# Patient Record
Sex: Male | Born: 1975
Health system: Southern US, Community
[De-identification: ages and names within clinical notes are randomized; demographics above are authoritative.]

## PROBLEM LIST (undated history)

## (undated) DIAGNOSIS — E079 Disorder of thyroid, unspecified: Secondary | ICD-10-CM

---

## 2003-09-20 ENCOUNTER — Ambulatory Visit: Payer: Self-pay | Admitting: Family Medicine

## 2004-09-03 ENCOUNTER — Ambulatory Visit: Payer: Self-pay | Admitting: Internal Medicine

## 2004-09-19 ENCOUNTER — Ambulatory Visit: Payer: Self-pay | Admitting: *Deleted

## 2004-09-24 ENCOUNTER — Ambulatory Visit: Payer: Self-pay | Admitting: Internal Medicine

## 2004-10-16 ENCOUNTER — Ambulatory Visit: Payer: Self-pay | Admitting: Family Medicine

## 2004-10-25 ENCOUNTER — Ambulatory Visit: Payer: Self-pay | Admitting: Family Medicine

## 2004-11-19 ENCOUNTER — Ambulatory Visit: Payer: Self-pay | Admitting: Family Medicine

## 2004-11-19 ENCOUNTER — Encounter (HOSPITAL_COMMUNITY): Admission: RE | Admit: 2004-11-19 | Discharge: 2004-12-13 | Payer: Self-pay | Admitting: Family Medicine

## 2004-11-30 ENCOUNTER — Emergency Department (HOSPITAL_COMMUNITY): Admission: EM | Admit: 2004-11-30 | Discharge: 2004-11-30 | Payer: Self-pay | Admitting: Emergency Medicine

## 2004-12-11 ENCOUNTER — Ambulatory Visit: Payer: Self-pay | Admitting: Family Medicine

## 2005-01-25 ENCOUNTER — Ambulatory Visit: Payer: Self-pay | Admitting: Family Medicine

## 2005-02-27 ENCOUNTER — Ambulatory Visit: Payer: Self-pay | Admitting: Internal Medicine

## 2005-04-11 ENCOUNTER — Ambulatory Visit: Payer: Self-pay | Admitting: Family Medicine

## 2005-04-25 ENCOUNTER — Ambulatory Visit: Payer: Self-pay | Admitting: Family Medicine

## 2005-05-01 ENCOUNTER — Encounter (HOSPITAL_COMMUNITY): Admission: RE | Admit: 2005-05-01 | Discharge: 2005-07-30 | Payer: Self-pay | Admitting: Family Medicine

## 2005-05-06 ENCOUNTER — Ambulatory Visit: Payer: Self-pay | Admitting: Family Medicine

## 2005-07-10 ENCOUNTER — Ambulatory Visit: Payer: Self-pay | Admitting: Family Medicine

## 2005-08-28 ENCOUNTER — Encounter (HOSPITAL_COMMUNITY): Admission: RE | Admit: 2005-08-28 | Discharge: 2005-09-26 | Payer: Self-pay | Admitting: Family Medicine

## 2005-09-19 ENCOUNTER — Ambulatory Visit: Payer: Self-pay | Admitting: Family Medicine

## 2005-11-19 ENCOUNTER — Ambulatory Visit: Payer: Self-pay | Admitting: Family Medicine

## 2006-03-28 ENCOUNTER — Ambulatory Visit: Payer: Self-pay | Admitting: Internal Medicine

## 2006-04-17 ENCOUNTER — Encounter (INDEPENDENT_AMBULATORY_CARE_PROVIDER_SITE_OTHER): Payer: Self-pay | Admitting: Cardiology

## 2006-04-17 ENCOUNTER — Encounter (INDEPENDENT_AMBULATORY_CARE_PROVIDER_SITE_OTHER): Payer: Self-pay | Admitting: Internal Medicine

## 2006-10-01 ENCOUNTER — Encounter (INDEPENDENT_AMBULATORY_CARE_PROVIDER_SITE_OTHER): Payer: Self-pay | Admitting: *Deleted

## 2006-12-15 ENCOUNTER — Telehealth (INDEPENDENT_AMBULATORY_CARE_PROVIDER_SITE_OTHER): Payer: Self-pay | Admitting: Internal Medicine

## 2007-01-05 ENCOUNTER — Ambulatory Visit: Payer: Self-pay | Admitting: Family Medicine

## 2007-01-05 ENCOUNTER — Encounter (INDEPENDENT_AMBULATORY_CARE_PROVIDER_SITE_OTHER): Payer: Self-pay | Admitting: Nurse Practitioner

## 2007-01-05 DIAGNOSIS — E018 Other iodine-deficiency related thyroid disorders and allied conditions: Secondary | ICD-10-CM

## 2007-01-06 LAB — CONVERTED CEMR LAB
Free T4: 1.07 ng/dL (ref 0.89–1.80)
TSH: 33.62 microintl units/mL — ABNORMAL HIGH (ref 0.350–5.50)

## 2007-03-03 ENCOUNTER — Ambulatory Visit: Payer: Self-pay | Admitting: Family Medicine

## 2007-03-03 ENCOUNTER — Encounter (INDEPENDENT_AMBULATORY_CARE_PROVIDER_SITE_OTHER): Payer: Self-pay | Admitting: Nurse Practitioner

## 2007-03-04 ENCOUNTER — Telehealth (INDEPENDENT_AMBULATORY_CARE_PROVIDER_SITE_OTHER): Payer: Self-pay | Admitting: Nurse Practitioner

## 2007-03-04 LAB — CONVERTED CEMR LAB
Free T4: 1.32 ng/dL (ref 0.89–1.80)
TSH: 8.325 microintl units/mL — ABNORMAL HIGH (ref 0.350–5.50)

## 2007-04-29 ENCOUNTER — Ambulatory Visit: Payer: Self-pay | Admitting: Nurse Practitioner

## 2007-04-29 LAB — CONVERTED CEMR LAB: TSH: 4.161 microintl units/mL (ref 0.350–5.50)

## 2007-07-01 ENCOUNTER — Ambulatory Visit: Payer: Self-pay | Admitting: Family Medicine

## 2007-07-03 LAB — CONVERTED CEMR LAB: TSH: 2.135 microintl units/mL (ref 0.350–5.50)

## 2007-11-24 ENCOUNTER — Ambulatory Visit: Payer: Self-pay | Admitting: Family Medicine

## 2007-11-24 LAB — CONVERTED CEMR LAB: TSH: 1.291 microintl units/mL (ref 0.350–4.50)

## 2008-06-28 ENCOUNTER — Telehealth (INDEPENDENT_AMBULATORY_CARE_PROVIDER_SITE_OTHER): Payer: Self-pay | Admitting: *Deleted

## 2008-12-29 ENCOUNTER — Ambulatory Visit: Payer: Self-pay | Admitting: Physician Assistant

## 2008-12-29 ENCOUNTER — Encounter (INDEPENDENT_AMBULATORY_CARE_PROVIDER_SITE_OTHER): Payer: Self-pay | Admitting: Nurse Practitioner

## 2008-12-29 LAB — CONVERTED CEMR LAB: TSH: 2.916 microintl units/mL (ref 0.350–4.500)

## 2008-12-30 ENCOUNTER — Encounter (INDEPENDENT_AMBULATORY_CARE_PROVIDER_SITE_OTHER): Payer: Self-pay | Admitting: Nurse Practitioner

## 2009-03-13 ENCOUNTER — Telehealth (INDEPENDENT_AMBULATORY_CARE_PROVIDER_SITE_OTHER): Payer: Self-pay | Admitting: Nurse Practitioner

## 2009-03-15 ENCOUNTER — Ambulatory Visit: Payer: Self-pay | Admitting: Nurse Practitioner

## 2009-12-22 ENCOUNTER — Encounter (INDEPENDENT_AMBULATORY_CARE_PROVIDER_SITE_OTHER): Payer: Self-pay | Admitting: Nurse Practitioner

## 2009-12-22 ENCOUNTER — Ambulatory Visit: Payer: Self-pay | Admitting: Nurse Practitioner

## 2009-12-22 LAB — CONVERTED CEMR LAB
ALT: 23 units/L (ref 0–53)
AST: 24 units/L (ref 0–37)
Albumin: 4.5 g/dL (ref 3.5–5.2)
Alkaline Phosphatase: 78 units/L (ref 39–117)
BUN: 13 mg/dL (ref 6–23)
Basophils Absolute: 0 10*3/uL (ref 0.0–0.1)
Basophils Relative: 1 % (ref 0–1)
CO2: 28 meq/L (ref 19–32)
Calcium: 9.2 mg/dL (ref 8.4–10.5)
Chloride: 100 meq/L (ref 96–112)
Creatinine, Ser: 0.84 mg/dL (ref 0.40–1.50)
Eosinophils Absolute: 0.2 10*3/uL (ref 0.0–0.7)
Eosinophils Relative: 2 % (ref 0–5)
Glucose, Bld: 95 mg/dL (ref 70–99)
HCT: 45.4 % (ref 39.0–52.0)
Hemoglobin: 14.9 g/dL (ref 13.0–17.0)
Lymphocytes Relative: 24 % (ref 12–46)
Lymphs Abs: 2.1 10*3/uL (ref 0.7–4.0)
MCHC: 32.8 g/dL (ref 30.0–36.0)
MCV: 84.7 fL (ref 78.0–100.0)
Monocytes Absolute: 0.7 10*3/uL (ref 0.1–1.0)
Monocytes Relative: 8 % (ref 3–12)
Neutro Abs: 5.6 10*3/uL (ref 1.7–7.7)
Neutrophils Relative %: 65 % (ref 43–77)
Platelets: 306 10*3/uL (ref 150–400)
Potassium: 4.6 meq/L (ref 3.5–5.3)
RBC: 5.36 M/uL (ref 4.22–5.81)
RDW: 13.5 % (ref 11.5–15.5)
Sodium: 137 meq/L (ref 135–145)
TSH: 2.249 microintl units/mL (ref 0.350–4.500)
Total Bilirubin: 0.4 mg/dL (ref 0.3–1.2)
Total Protein: 7.1 g/dL (ref 6.0–8.3)
WBC: 8.6 10*3/uL (ref 4.0–10.5)

## 2009-12-25 ENCOUNTER — Encounter (INDEPENDENT_AMBULATORY_CARE_PROVIDER_SITE_OTHER): Payer: Self-pay | Admitting: Nurse Practitioner

## 2010-01-04 ENCOUNTER — Encounter (INDEPENDENT_AMBULATORY_CARE_PROVIDER_SITE_OTHER): Payer: Self-pay | Admitting: Nurse Practitioner

## 2010-01-04 ENCOUNTER — Ambulatory Visit: Payer: Self-pay | Admitting: Nurse Practitioner

## 2010-01-04 LAB — CONVERTED CEMR LAB
Cholesterol: 193 mg/dL (ref 0–200)
HDL: 47 mg/dL (ref >39)
LDL Cholesterol: 129 mg/dL — ABNORMAL HIGH (ref 0–99)
Total CHOL/HDL Ratio: 4.1
Triglycerides: 83 mg/dL (ref <150)
VLDL: 17 mg/dL (ref 0–40)

## 2010-01-05 ENCOUNTER — Encounter (INDEPENDENT_AMBULATORY_CARE_PROVIDER_SITE_OTHER): Payer: Self-pay | Admitting: Nurse Practitioner

## 2010-02-11 LAB — CONVERTED CEMR LAB
Bilirubin Urine: NEGATIVE
Blood in Urine, dipstick: NEGATIVE
Glucose, Urine, Semiquant: NEGATIVE
Ketones, urine, test strip: NEGATIVE
Nitrite: NEGATIVE
Protein, U semiquant: NEGATIVE
Specific Gravity, Urine: 1.015
Urobilinogen, UA: 0.2
WBC Urine, dipstick: NEGATIVE
pH: 7.5

## 2010-02-14 NOTE — Letter (Signed)
Summary: Handout Printed  Printed Handout:  - Plantar Fasciitis 

## 2010-02-14 NOTE — Progress Notes (Signed)
Summary: Office Visit//DEPRESSION SCREENING  Office Visit//DEPRESSION SCREENING   Imported By: Arta Bruce 12/22/2009 12:26:11  _____________________________________________________________________  External Attachment:    Type:   Image     Comment:   External Document

## 2010-02-14 NOTE — Progress Notes (Signed)
   Phone Note Call from Patient Call back at [facility] Phone [phone]   Summary of Call: Since last night, the pt has fever 101.5) and he is taking tylenol but the fever comes and go.   Initial call taken by: Manon Hilding,  March 13, 2009 8:46 AM  Follow-up for Phone Call        spoke with pt and he says he feels alot better Follow-up by: [place] Shannon,  March 16, 2009 12:34 PM

## 2010-02-14 NOTE — Assessment & Plan Note (Signed)
Summary: Acute - Sinusitis   Vital Signs:  Patient profile:   35 year old male Weight:      200.8 pounds Temp:     98.8 degrees F oral Pulse rate:   65 / minute Pulse rhythm:   regular Resp:     16 per minute BP sitting:   125 / 77  (left arm) Cuff size:   large  Vitals Entered By: Levon Hedger (March 15, 2009 9:04 AM) CC: pt had fever sunday and monday and feeling dizzy,headache and nasal congestion with nose bleed, some cough and has been using Tylenol for fever Is Patient Diabetic? No Pain Assessment Patient in pain? yes     Location: headache  Does patient need assistance? Functional Status Self care Ambulation Normal   Primary Care Provider:  Tereso Newcomer, PA-C  CC:  pt had fever sunday and monday and feeling dizzy, headache and nasal congestion with nose bleed, and some cough and has been using Tylenol for fever.  History of Present Illness:  Pt into the office with compaints of congestion Symptoms started 4 days ago +fever on first 2 days +nausea +headache +appetite has decreased +Right ear pain, left ear pain -sore throat +nasal congestion  Pt has been taking tylenol ES and also some OTC cold products for cough  Social - pt is employed at a nursery - flowers.  He has been to work earlier this week but was unable to go today due to illness  Presents today with wife and child.   Habits & Providers  Alcohol-Tobacco-Diet     Alcohol drinks/day: 0     Tobacco Status: quit  Exercise-Depression-Behavior     Does Patient Exercise: yes     Type of exercise: weight lifting     Have you felt down or hopeless? no     Have you felt little pleasure in things? no     Depression Counseling: not indicated; screening negative for depression     Drug Use: no     Sun Exposure: occasionally  Allergies (verified): No Known Drug Allergies  Review of Systems General:  Complains of chills, fever, and loss of appetite. ENT:  Complains of earache, nasal  congestion, nosebleeds, and sinus pressure; denies sore throat; right ear,. CV:  Complains of chest pain or discomfort; when cough. Resp:  Denies cough. GI:  Denies abdominal pain. GU:  Denies discharge. Allergy:  Denies sneezing.  Physical Exam  General:  alert.   Head:  normocephalic.   Eyes:  pupils round.   Ears:  left ear cerumen impaction right ear - inflammation, soft wax (pt put some alcohol drops in the ear) Nose:  Bil turbinate inflammation right nare with dried bloody drainage frontal sinus tenderness Mouth:  poor dentition.   Lungs:  normal breath sounds.   Heart:  normal rate and regular rhythm.     Impression & Recommendations:  Problem # 1:  SINUSITIS (ICD-473.9) advised pt of dx humidify air at home continue otc allergy meds for relief His updated medication list for this problem includes:    Amoxicillin 500 Mg Tabs (Amoxicillin) ..... One tablet by mouth three times a day for infection    Fluticasone Propionate 50 Mcg/act Susp (Fluticasone propionate) ..... One spray in each nostril two times a day **hold head down**  Problem # 2:  CERUMEN IMPACTION, BILATERAL (ICD-380.4) advised pt to schedule an appointment for ear irrigation when feeling better  Complete Medication List: 1)  Levothroid 125 Mcg Tabs (Levothyroxine sodium) .Marland KitchenMarland KitchenMarland Kitchen  1 tablet by mouth daily for thyroid 2)  Ketoconazole 2 % Crea (Ketoconazole) .... Apply two times a day x 2 weeks 3)  Amoxicillin 500 Mg Tabs (Amoxicillin) .... One tablet by mouth three times a day for infection 4)  Fluticasone Propionate 50 Mcg/act Susp (Fluticasone propionate) .... One spray in each nostril two times a day **hold head down**  Patient Instructions: 1)  May continue the over the counter cough and allergy medications 2)  Take amoxil 500mg  by mouth three times a day for infection 3)  use the nasal spray twice daily - hold head down 4)  Follow up as needed 5)  You can schedule an appointment to get your ears  cleaned when you are feeling better Prescriptions: FLUTICASONE PROPIONATE 50 MCG/ACT SUSP (FLUTICASONE PROPIONATE) One spray in each nostril two times a day **hold head down**  #1 x 1   Entered and Authorized by:   [name] FNP   Signed by:   [name] FNP on 03/15/2009   Method used:   Print then Give to Patient   RxID:   [id number] AMOXICILLIN 500 MG TABS (AMOXICILLIN) One tablet by mouth three times a day for infection  #21 x 0   Entered and Authorized by:   [name] FNP   Signed by:   [name] FNP on 03/15/2009   Method used:   Print then Give to Patient   RxID:   [id number]

## 2010-02-14 NOTE — Assessment & Plan Note (Signed)
Summary: Complete Physcial Exam   Vital Signs:  Patient profile:   35 year old male Height:      67. inches Weight:      203.6 pounds BMI:     32.00 Temp:     98.0 degrees F oral Pulse rate:   68 / minute Pulse rhythm:   regular Resp:     16 per minute BP sitting:   136 / 80  (left arm) Cuff size:   regular  Vitals Entered By: Levon Hedger (December 22, 2009 8:36 AM)  Nutrition Counseling: Patient's BMI is greater than 25 and therefore counseled on weight management options. CC: CPP Is Patient Diabetic? No Pain Assessment Patient in pain? no       Does patient need assistance? Functional Status Self care Ambulation Normal   Primary Care Kiarra Kidd:  Tereso Newcomer, PA-C  CC:  CPP.  History of Present Illness:  Pt into the office for a complete physical exam Pt is not fasting today for labs. Pt also admits that he has drank an energy and he does drink daily.  Optho - no glasses or contact.  No problems with the eyes.    Dental - Pt went 12/13/2009 for extraction  Social - Pt is employed in landscaping married for 15 years.with 3 children  Habits & Providers  Alcohol-Tobacco-Diet     Alcohol drinks/day: 0     Tobacco Status: quit  Exercise-Depression-Behavior     Does Patient Exercise: yes     Exercise Counseling: not indicated; exercise is adequate     Type of exercise: weight lifting     Depression Counseling: not indicated; screening negative for depression     Drug Use: no     Sun Exposure: occasionally  Comments: Pt plays basketball and baseball. PHQ-9 score = 1  Medications Prior to Update: 1)  Levothroid 125 Mcg  Tabs (Levothyroxine Sodium) .Marland Kitchen.. 1 Tablet By Mouth Daily For Thyroid 2)  Ketoconazole 2 % Crea (Ketoconazole) .... Apply Two Times A Day X 2 Weeks 3)  Fluticasone Propionate 50 Mcg/act Susp (Fluticasone Propionate) .... One Spray in Each Nostril Two Times A Day **hold Head Down**  Allergies (verified): No Known Drug  Allergies  Review of Systems General:  Denies fever. Eyes:  Denies blurring. ENT:  Denies earache. CV:  Denies chest pain or discomfort. Resp:  Denies cough. GI:  Denies abdominal pain, nausea, and vomiting. GU:  Denies dysuria. MS:  Complains of joint pain; left knee injury while playing basketball on yesterday pain in feet R>L especially upon waking in the morning and after playing basketball. Derm:  Complains of itching; denies dryness; jock area. Neuro:  Denies headaches. Psych:  Denies anxiety and depression.  Physical Exam  General:  alert.   Head:  normocephalic.   Eyes:  pupils round.   Ears:  bil ears with cerumen impaction Nose:  no nasal discharge.   Mouth:  fair dentition.   Neck:  supple.   Chest Wall:  no masses.   Breasts:  no gynecomastia.   Lungs:  normal breath sounds.   Heart:  normal rate and regular rhythm.   Abdomen:  normal bowel sounds.   Rectal:  defer Genitalia:  circumcised and no hydrocele.   Prostate:  defer Msk:  normal ROM.   Pulses:  R radial normal and L radial normal.   Extremities:  no edema Neurologic:  alert & oriented X3.   Skin:  color normal.   Psych:  Oriented X3.  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 1 tablet by mouth daily for thyroid  Orders: T-TSH (25366-44034) EKG w/ Interpretation (93000)  Problem # 3:   FASCIITIS, PLANTAR (ICD-728.71) handout given  advised pt to do exercises His updated medication list for this problem includes:    Ibuprofen 800 Mg Tabs (Ibuprofen) ..... One tablet by mouth two times a day as needed for knee pain  Problem # 4:  CERUMEN IMPACTION, BILATERAL (ICD-380.4) pt to return for irrigation  Problem # 5:  TINEA CRURIS (ICD-110.3) will refill cream  Complete Medication List: 1)  Levothroid 125 Mcg Tabs (Levothyroxine sodium) .Marland Kitchen.. 1 tablet by mouth daily for thyroid 2)  Ketoconazole 2 % Crea (Ketoconazole) .... Apply two times a day x 2 weeks 3)  Ibuprofen 800 Mg Tabs (Ibuprofen) .... One tablet by mouth two times a day as needed for knee pain  Patient Instructions: 1)  Schedule a fasting lab visit in the next 2 weeks for fasting labs - lipids. 2)  Left knee - Do not play basketball for the next week.  3)  Take ibuprofen 800mg  by mouth two times a day (with food) for the next 3 days then as needed 4)  Avoid activities that cause you to bend your knees over the next few days 5)  Thyroid medications will be sent to the pharmacy once your labs are reviewed 6)  You have been given the tetanus vaccine today.  It will be good for 10 years 7)  Follow up as needed Prescriptions: KETOCONAZOLE 2 % CREA (KETOCONAZOLE) apply two times a day x 2 weeks  #30 gm x 1   Entered and Authorized by:   [name] FNP   Signed by:   [name] FNP on 12/22/2009   Method used:   Print then Give to Patient   RxID:   [id number] IBUPROFEN 800 MG TABS (IBUPROFEN) One tablet by mouth two times a day as needed for knee pain  #30 x 0   Entered and Authorized by:   [name] FNP   Signed by:   [name] FNP on 12/22/2009   Method used:   Print then Give to Patient   RxID:   [id number]    Orders Added: 1)  Est. Patient age 29-39 [99395] 2)  UA Dipstick w/o Micro (manual) [81002] 3)  T-Comprehensive Metabolic Panel [80053-22900] 4)  T-CBC w/Diff  [10932-35573] 5)  T-TSH [22025-42706] 6)  Rapid HIV  [92370] 7)  EKG w/ Interpretation [93000]    Laboratory Results   Urine Tests  Date/Time Received: December 22, 2009 8:53 AM   Routine Urinalysis   Color: lt. yellow Appearance: Clear Glucose: negative   (Normal Range: Negative) Bilirubin: negative   (Normal Range: Negative) Ketone: negative   (Normal Range: Negative) Spec. Gravity: 1.015   (Normal Range: 1.003-1.035) Blood:  negative   (Normal Range: Negative) pH: 7.5   (Normal Range: 5.0-8.0) Protein: negative   (Normal Range: Negative) Urobilinogen: 0.2   (Normal Range: 0-1) Nitrite: negative   (Normal Range: Negative) Leukocyte Esterace: negative   (Normal Range: Negative)        Prevention & Chronic Care Immunizations   Influenza vaccine: Not documented   Influenza vaccine deferral: Refused  (12/22/2009)    Tetanus booster: Not documented    Pneumococcal vaccine: Not documented  Other Screening   Smoking status: quit  (12/22/2009)   Nursing Instructions: Give tetanus booster today    EKG  Procedure date:  12/22/2009  Findings:      sinus brady   Appended Document: Lab Order    Lab Visit  Laboratory Results    Other Tests  Rapid HIV: negative  Orders Today:   Appended Document: Complete Physcial Exam     Allergies: No Known Drug Allergies   Complete Medication List: 1)  Levothroid 125 Mcg Tabs (Levothyroxine sodium) .Marland Kitchen.. 1 tablet by mouth daily for thyroid 2)  Ketoconazole 2 % Crea (Ketoconazole) .... Apply two times a day x 2 weeks 3)  Ibuprofen 800 Mg Tabs (Ibuprofen) .... One tablet by mouth two times a day as needed for knee pain  Other Orders: Tdap => 55yrs IM (16109) Admin 1st Vaccine (60454)   Orders Added: 1)  Tdap => 57yrs IM [90715] 2)  Admin 1st Vaccine [09811]   Immunizations Administered:  Tetanus Vaccine:    Vaccine Type: Tdap    Site: right deltoid    Mfr: Sanofi Pasteur    Dose: 0.5 ml     Route: IM    Given by: Levon Hedger    Exp. Date: 04/12/2012    Lot #: [id number]    VIS given: 12/02/07 version given December 22, 2009.    ndc  56213-086-57  Immunizations Administered:  Tetanus Vaccine:    Vaccine Type: Tdap    Site: right deltoid    Mfr: Sanofi Pasteur    Dose: 0.5 ml    Route: IM    Given by: Levon Hedger    Exp. Date: 04/12/2012    Lot #: [id number]    VIS given: 12/02/07 version given December 22, 2009.

## 2010-02-14 NOTE — Letter (Signed)
Summary: Work Excuse  [facility]  [address] [place], [place] [address]   Phone: [phone]  Fax: [phone]    Today's Date: March 15, 2009  Name of Patient: Daniel Fuller  The above named patient had a medical visit today   Please take this into consideration when reviewing the time away from work  Special Instructions:  [  ] None  [ X ] To be off the remainder of today, returning to the normal work / school schedule tomorrow.  [  ] To be off until the next scheduled appointment on ______________________.  [  ] Other ________________________________________________________________ ________________________________________________________________________   Sincerely yours,   [name] FNP [facility]

## 2010-02-15 NOTE — Letter (Signed)
Summary: *HSN Results Follow up  [facility]  [address] [place], [place] [address]   Phone: [phone]  Fax: [phone]      12/25/2009   [facility] [address] [place], [place]  [address]   Dear  Mr. Daniel Fuller,                            ____S.Drinkard,FNP   ____D. Gore,FNP       ____B. McPherson,MD   ____V. Rankins,MD    ____E. Mulberry,MD    __X__N. Daphine Deutscher, FNP  ____D. Reche Dixon, MD    ____K. Philipp Deputy, MD    ____Other     This letter is to inform you that your recent test(s):  ___X____Pap Smear    __X_____Lab Test     _______X-ray    ___X____ is within acceptable limits  _______ requires a medication change  _______ requires a follow-up lab visit  _______ requires a follow-up visit with your [name]   Comments:  Labs done during recent office visit are normal.  Pap Smear results ___________________.       _________________________________________________________ If you have any questions, please contact our office [phone].                    Sincerely,    [name] FNP [facility]

## 2010-02-15 NOTE — Assessment & Plan Note (Signed)
Summary: FLP & EAR IRRIGATION PER FNP MARTIN / NS  Nurse Visit   Vital Signs:  Patient profile:   35 year old male Temp:     97.3 degrees F oral Pulse rate:   64 / minute Pulse rhythm:   regular  Vitals Entered By: Dutch Quint RN (January 04, 2010 9:39 AM)  Primary Care Provider:  Tereso Newcomer, PA-C  CC:  FLP and bilateral ear irrigation.  History of Present Illness: 12/22/09 both ears had cerumen impaction - here for f/u ear irrigation and FLP.  States slight hearing difficulty in left ear.   Impression & Recommendations:  Problem # 1:  CERUMEN IMPACTION, BILATERAL (ICD-380.4)  Irrigation of large plugs of cerumen both ears States hearing is better out of left ear  Orders: Cerumen Impaction Removal ([address])  Complete Medication List: 1)  Levothroid 125 Mcg Tabs (Levothyroxine sodium) .Marland Kitchen.. 1 tablet by mouth daily for thyroid 2)  Ketoconazole 2 % Crea (Ketoconazole) .... Apply two times a day x 2 weeks 3)  Ibuprofen 800 Mg Tabs (Ibuprofen) .... One tablet by mouth two times a day as needed for knee pain  Other Orders: T-Lipid Profile (98119-14782)   Review of Systems ENT:  Some difficulty hearing out of left ear sometimes..   Patient Instructions: 1)  Your ears had the wax flushed out today. 2)  Let us know if you don't have any improvement in your hearing, have pain or discharge from your ears. 3)  We will let you know the results of your labwork. 4)  Call if anything changes or if you have any questions.   Physical Exam  Ears:  R cerumen impaction and L Cerumen impaction.    CC: FLP and bilateral ear irrigation   Allergies: No Known Drug Allergies  Orders Added: 1)  Est. Patient Level I [95621] 2)  T-Lipid Profile [80061-22930] 3)  Cerumen Impaction Removal [30865]

## 2010-02-15 NOTE — Letter (Signed)
Summary: Lipid Letter  [facility]  [address] [place], [place] [address]   Phone: [phone]  Fax: [phone]    01/05/2010  Daniel Fuller [address] [place], [place]  [address]  Dear Daniel Fuller:  We have carefully reviewed your last lipid profile from 01/04/2010 and the results are noted below with a summary of recommendations for lipid management.    Cholesterol:       193     Goal: less than 200   HDL "good" Cholesterol:   47     Goal: greater than 40   LDL "bad" Cholesterol:   129     Goal: less than 130   Triglycerides:       83     Goal: less than 150    Labs done during your physical exam and your cholesterol labs done on last visit are normal.     Current Medications: 1)    Levothroid 125 Mcg  Tabs (Levothyroxine sodium) .Marland Kitchen.. 1 tablet by mouth daily for thyroid 2)    Ketoconazole 2 % Crea (Ketoconazole) .... Apply two times a day x 2 weeks 3)    Ibuprofen 800 Mg Tabs (Ibuprofen) .... One tablet by mouth two times a day as needed for knee pain  If you have any questions, please call. We appreciate being able to work with you.   Sincerely,    [facility] [name] FNP

## 2012-05-28 ENCOUNTER — Encounter (HOSPITAL_COMMUNITY): Payer: Self-pay

## 2012-05-28 ENCOUNTER — Emergency Department (HOSPITAL_COMMUNITY)
Admission: EM | Admit: 2012-05-28 | Discharge: 2012-05-28 | Disposition: A | Discharge status: Home / Self Care | Payer: No Typology Code available for payment source | Source: Home / Self Care

## 2012-05-28 DIAGNOSIS — E039 Hypothyroidism, unspecified: Secondary | ICD-10-CM

## 2012-05-28 DIAGNOSIS — J329 Chronic sinusitis, unspecified: Secondary | ICD-10-CM

## 2012-05-28 HISTORY — DX: Disorder of thyroid, unspecified: E07.9

## 2012-05-28 LAB — TSH: TSH: 1.017 u[IU]/mL (ref 0.350–4.500)

## 2012-05-28 MED ORDER — LEVOTHYROXINE SODIUM 125 MCG PO TABS: 125.0000 ug | ORAL_TABLET | Freq: Every day | ORAL | Status: DC

## 2012-05-28 MED ORDER — LORATADINE 10 MG PO TABS: 10.0000 mg | ORAL_TABLET | Freq: Every day | ORAL | Status: DC

## 2012-05-28 MED ORDER — OXYMETAZOLINE HCL 0.05 % NA SOLN: 2.0000 | Freq: Two times a day (BID) | NASAL | Status: DC | PRN

## 2012-05-28 NOTE — ED Notes (Signed)
Please call [phone] for lab issues

## 2012-05-28 NOTE — ED Notes (Signed)
Needs new thyroid Rx

## 2012-05-28 NOTE — ED Provider Notes (Signed)
History     CSN: [id number]  Arrival date & time 05/28/12  1616   First MD Initiated Contact with Patient 05/28/12 1731      Chief Complaint  Patient presents with  . Thyroid Problem    (Consider location/radiation/quality/duration/timing/severity/associated sxs/prior treatment) HPI  The patient is a 37 year old male with history of hypothyroidism presented to the urgent care for refills of the Synthroid. Patient is doing well with no other complaints except recent allergies and sinusitis causing headache and nasal congestion. Denies any fevers or chills, shortness of breath or wheezing or productive cough..  Past Medical History  Diagnosis Date  . Thyroid disease     History reviewed. No pertinent past surgical history.  No family history on file.  History  Substance Use Topics  . Smoking status: Not on file  . Smokeless tobacco: Not on file  . Alcohol Use: Not on file      Review of Systems  Constitutional: Negative.   HENT: Positive for congestion, sneezing, postnasal drip and sinus pressure.   Eyes: Negative.   Respiratory: Negative.   Cardiovascular: Negative.   Endocrine: Negative.   Genitourinary: Negative.   Allergic/Immunologic: Positive for environmental allergies.  Neurological: Negative.     Allergies  Review of patient's allergies indicates not on file.  Home Medications   Current Outpatient Rx  Name  Route  Sig  Dispense  Refill  . levothyroxine (SYNTHROID) 125 MCG tablet   Oral   Take 1 tablet (125 mcg total) by mouth daily before breakfast.   90 tablet   3   . loratadine (CLARITIN) 10 MG tablet   Oral   Take 1 tablet (10 mg total) by mouth daily.   30 tablet   1   . oxymetazoline (AFRIN NASAL SPRAY) 0.05 % nasal spray   Nasal   Place 2 sprays into the nose 2 (two) times daily as needed for congestion.   30 mL   1     BP 122/66  Pulse 55  Temp(Src) 98.5 F (36.9 C) (Oral)  Resp 16  SpO2 100%  Physical Exam   Constitutional: He is oriented to person, place, and time. He appears well-developed and well-nourished.  HENT:  Head: Normocephalic and atraumatic.  Mouth/Throat: Oropharynx is clear and moist.  Eyes: Pupils are equal, round, and reactive to light.  Neck: Normal range of motion. Neck supple. No thyromegaly present.  Cardiovascular: Normal rate and regular rhythm.   Pulmonary/Chest: Effort normal and breath sounds normal.  Abdominal: Soft. Bowel sounds are normal.  Neurological: He is alert and oriented to person, place, and time.    ED Course  Procedures (including critical care time)  Labs Reviewed  TSH   No results found.   1. Hypothyroidism   2. Sinusitis       MDM  Hypothyroidism - Will draw TSH - Patient has been doing well on Synthroid 125 MCG daily, will give him the prescription and advised patient not to fill it until his TSH results are back (if he needs a dose change). He has a few tabs still left in his bottle. Continue the same dose of Synthroid if the TSH <4.  Sinusitis: Currently stable - Gave him the prescription of Claritin daily and afrin nasal spray as needed.    Anselm Aumiller M.D. [facility] 05/28/2012, 6:12 PM  Pager: [phone]           Cathren Harsh, MD 05/28/12 4696

## 2012-05-28 NOTE — ED Notes (Signed)
Call back number for lab issues verified w pt

## 2012-07-07 ENCOUNTER — Ambulatory Visit: Payer: No Typology Code available for payment source

## 2012-07-07 ENCOUNTER — Ambulatory Visit: Payer: No Typology Code available for payment source | Attending: Family Medicine | Admitting: Internal Medicine

## 2012-07-07 VITALS — BP 126/77 | HR 95 | Temp 98.7°F | Resp 18 | Ht 63.0 in | Wt 200.0 lb

## 2012-07-07 DIAGNOSIS — E039 Hypothyroidism, unspecified: Secondary | ICD-10-CM | POA: Insufficient documentation

## 2012-07-07 DIAGNOSIS — E018 Other iodine-deficiency related thyroid disorders and allied conditions: Secondary | ICD-10-CM

## 2012-07-07 MED ORDER — LEVOTHYROXINE SODIUM 125 MCG PO TABS: 125.0000 ug | ORAL_TABLET | Freq: Every day | ORAL | Status: DC

## 2012-07-07 NOTE — Patient Instructions (Addendum)

## 2012-07-07 NOTE — Progress Notes (Signed)
Patient ID: Daniel Fuller, male   DOB: [birth year], 37 y.o.   MRN: [id number]  CC: new patient  HPI: 37 year old male with past medical history of hypothyroidism who presented to clinic to establish care. Patient has no current complaints. Patient reports being compliant with Synthroid. No chest pain, shortness of breath or palpitations.  No Known Allergies Past Medical History  Diagnosis Date  . Thyroid disease    Current Outpatient Prescriptions on File Prior to Visit  Medication Sig Dispense Refill  . levothyroxine (SYNTHROID) 125 MCG tablet Take 1 tablet (125 mcg total) by mouth daily before breakfast.  90 tablet  3  . loratadine (CLARITIN) 10 MG tablet Take 1 tablet (10 mg total) by mouth daily.  30 tablet  1  . oxymetazoline (AFRIN NASAL SPRAY) 0.05 % nasal spray Place 2 sprays into the nose 2 (two) times daily as needed for congestion.  30 mL  1   No current facility-administered medications on file prior to visit.   Family medical history significant for HTN, HLD  History   Social History  . Marital Status: Married    Spouse Name: N/A    Number of Children: N/A  . Years of Education: N/A   Occupational History  . Not on file.   Social History Main Topics  . Smoking status: Not on file  . Smokeless tobacco: Not on file  . Alcohol Use: Not on file  . Drug Use: Not on file  . Sexually Active: Not on file   Other Topics Concern  . Not on file   Social History Narrative  . No narrative on file    Review of Systems  Constitutional: Negative for fever, chills, diaphoresis, activity change, appetite change and fatigue.  HENT: Negative for ear pain, nosebleeds, congestion, facial swelling, rhinorrhea, neck pain, neck stiffness and ear discharge.   Eyes: Negative for pain, discharge, redness, itching and visual disturbance.  Respiratory: Negative for cough, choking, chest tightness, shortness of breath, wheezing and stridor.   Cardiovascular: Negative for  chest pain, palpitations and leg swelling.  Gastrointestinal: Negative for abdominal distention.  Genitourinary: Negative for dysuria, urgency, frequency, hematuria, flank pain, decreased urine volume, difficulty urinating and dyspareunia.  Musculoskeletal: Negative for back pain, joint swelling, arthralgias and gait problem.  Neurological: Negative for dizziness, tremors, seizures, syncope, facial asymmetry, speech difficulty, weakness, light-headedness, numbness and headaches.  Hematological: Negative for adenopathy. Does not bruise/bleed easily.  Psychiatric/Behavioral: Negative for hallucinations, behavioral problems, confusion, dysphoric mood, decreased concentration and agitation.    Objective:   Filed Vitals:   07/07/12 1711  BP: 126/77  Pulse: 95  Temp: 98.7 F (37.1 C)  Resp: 18    Physical Exam  Constitutional: Appears well-developed and well-nourished. No distress.  HENT: Normocephalic. External right and left ear normal. Oropharynx is clear and moist.  Eyes: Conjunctivae and EOM are normal. PERRLA, no scleral icterus.  Neck: Normal ROM. Neck supple. No JVD. No tracheal deviation. No thyromegaly.  CVS: RRR, S1/S2 +, no murmurs, no gallops, no carotid bruit.  Pulmonary: Effort and breath sounds normal, no stridor, rhonchi, wheezes, rales.  Abdominal: Soft. BS +,  no distension, tenderness, rebound or guarding.  Musculoskeletal: Normal range of motion. No edema and no tenderness.  Lymphadenopathy: No lymphadenopathy noted, cervical, inguinal. Neuro: Alert. Normal reflexes, muscle tone coordination. No cranial nerve deficit. Skin: Skin is warm and dry. No rash noted. Not diaphoretic. No erythema. No pallor.  Psychiatric: Normal mood and affect. Behavior, judgment, thought content normal.  Lab Results  Component Value Date   WBC 8.6 12/22/2009   HGB 14.9 12/22/2009   HCT 45.4 12/22/2009   MCV 84.7 12/22/2009   PLT 306 12/22/2009   Lab Results  Component Value Date    CREATININE 0.84 12/22/2009   BUN 13 12/22/2009   NA 137 12/22/2009   K 4.6 12/22/2009   CL 100 12/22/2009   CO2 28 12/22/2009    No results found for this basename: HGBA1C   Lipid Panel     Component Value Date/Time   CHOL 193 01/04/2010 2111   TRIG 83 01/04/2010 2111   HDL 47 01/04/2010 2111   CHOLHDL 4.1 Ratio 01/04/2010 2111   VLDL 17 01/04/2010 2111   LDLCALC 129* 01/04/2010 2111       Assessment and plan:   Patient Active Problem List   Diagnosis Date Noted  . HYPOTHYROIDISM, POST-RADIOACTIVE IODINE - TSH WNL - continue Synthroid 125 mc daily - recheck TSH in 5 months 01/05/2007

## 2012-07-07 NOTE — Progress Notes (Signed)
Patient here to establish Takes medication for thyroid

## 2013-07-21 ENCOUNTER — Telehealth: Payer: Self-pay | Admitting: Nurse Practitioner

## 2013-07-21 NOTE — Telephone Encounter (Signed)
Pt. Needs refill on levothyroxine (SYNTHROID) 125 MCG tablet [[id number]] ,patient has appointment on 7/23, but would like a temporary refill.

## 2013-07-23 ENCOUNTER — Telehealth: Payer: Self-pay | Admitting: Internal Medicine

## 2013-07-23 ENCOUNTER — Other Ambulatory Visit: Payer: Self-pay | Admitting: *Deleted

## 2013-07-23 DIAGNOSIS — E038 Other specified hypothyroidism: Secondary | ICD-10-CM

## 2013-07-23 MED ORDER — LEVOTHYROXINE SODIUM 125 MCG PO TABS: 125.0000 ug | ORAL_TABLET | Freq: Every day | ORAL | Status: DC

## 2013-07-23 NOTE — Telephone Encounter (Signed)
Pt. Needs refill for Levothyroxine 125 mcg, he has only for the weekend. Pt [facility]. Please, f/u with Pt.

## 2013-07-23 NOTE — Progress Notes (Signed)
Pt needed a refill for his levothyroxine.  So I sent the rx to his pharmacy.

## 2013-07-23 NOTE — Telephone Encounter (Signed)
I refilled his medication.

## 2013-07-27 ENCOUNTER — Other Ambulatory Visit: Payer: Self-pay | Admitting: *Deleted

## 2013-07-27 DIAGNOSIS — E038 Other specified hypothyroidism: Secondary | ICD-10-CM

## 2013-07-27 MED ORDER — LEVOTHYROXINE SODIUM 125 MCG PO TABS: 125.0000 ug | ORAL_TABLET | Freq: Every day | ORAL | Status: DC

## 2013-08-05 ENCOUNTER — Ambulatory Visit: Payer: Self-pay | Attending: Internal Medicine | Admitting: Internal Medicine

## 2013-08-05 ENCOUNTER — Encounter: Payer: Self-pay | Admitting: Internal Medicine

## 2013-08-05 ENCOUNTER — Ambulatory Visit: Payer: Self-pay

## 2013-08-05 VITALS — BP 119/71 | HR 48 | Temp 97.8°F | Resp 18 | Ht 69.0 in | Wt 201.8 lb

## 2013-08-05 DIAGNOSIS — R0989 Other specified symptoms and signs involving the circulatory and respiratory systems: Secondary | ICD-10-CM

## 2013-08-05 DIAGNOSIS — R6889 Other general symptoms and signs: Secondary | ICD-10-CM

## 2013-08-05 DIAGNOSIS — Z0000 Encounter for general adult medical examination without abnormal findings: Secondary | ICD-10-CM

## 2013-08-05 DIAGNOSIS — B356 Tinea cruris: Secondary | ICD-10-CM

## 2013-08-05 DIAGNOSIS — E039 Hypothyroidism, unspecified: Secondary | ICD-10-CM | POA: Insufficient documentation

## 2013-08-05 DIAGNOSIS — Z87891 Personal history of nicotine dependence: Secondary | ICD-10-CM | POA: Insufficient documentation

## 2013-08-05 DIAGNOSIS — Z923 Personal history of irradiation: Secondary | ICD-10-CM | POA: Insufficient documentation

## 2013-08-05 LAB — POCT URINALYSIS DIPSTICK
Bilirubin, UA: NEGATIVE
Blood, UA: NEGATIVE
Glucose, UA: NEGATIVE
Ketones, UA: NEGATIVE
Leukocytes, UA: NEGATIVE
Nitrite, UA: NEGATIVE
Protein, UA: NEGATIVE
Spec Grav, UA: 1.02
Urobilinogen, UA: 0.2
pH, UA: 7.5

## 2013-08-05 MED ORDER — CLOTRIMAZOLE 1 % EX CREA: 1.0000 "application " | TOPICAL_CREAM | Freq: Two times a day (BID) | CUTANEOUS | Status: DC

## 2013-08-05 NOTE — Progress Notes (Signed)
Patient ID: Daniel Fuller, male   DOB: [birth year], 38 y.o.   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  He reports that he tries to stay hydrated while outside.  He has some c/o of fullness and pressure in throat.  He has had radiation therapy in the past on his thyroid.  He c/o of itching and rash in groin area.    No Known Allergies Past Medical History  Diagnosis Date  . Thyroid disease    Current Outpatient Prescriptions on File Prior to Visit  Medication Sig Dispense Refill  . levothyroxine (SYNTHROID) 125 MCG tablet Take 1 tablet (125 mcg total) by mouth daily before breakfast.  90 tablet  0  . loratadine (CLARITIN) 10 MG tablet Take 1 tablet (10 mg total) by mouth daily.  30 tablet  1  . oxymetazoline (AFRIN NASAL SPRAY) 0.05 % nasal spray Place 2 sprays into the nose 2 (two) times daily as needed for congestion.  30 mL  1   No current facility-administered medications on file prior to visit.   History reviewed. No pertinent family history. History   Social History  . Marital Status: Married    Spouse Name: N/A    Number of Children: N/A  . Years of Education: N/A   Occupational History  . Not on file.   Social History Main Topics  . Smoking status: Former Smoker -- 0.40 packs/day for 6 years    Types: Cigarettes    Start date: 08/06/1994    Quit date: 08/05/2000  . Smokeless tobacco: Not on file  . Alcohol Use: Not on file  . Drug Use: Not on file  . Sexual Activity: Not on file   Other Topics Concern  . Not on file   Social History Narrative  . No narrative on file    Review of Systems  Constitutional: Positive for malaise/fatigue.  Gastrointestinal: Positive for constipation.  Neurological: Positive for headaches.      Objective:   Filed Vitals:   08/05/13 1650  BP: 119/71  Pulse: 48  Temp: 97.8 F (36.6 C)  Resp: 18   Physical Exam    HENT:  Right Ear: External ear normal.  Left Ear: External ear normal.  Mouth/Throat: Oropharynx is clear and moist.  Eyes: Conjunctivae and EOM are normal. Pupils are equal, round, and reactive to light.  Neck: Normal range of motion. Neck supple.  Cardiovascular: Normal rate, regular rhythm, normal heart sounds and intact distal pulses.   Pulmonary/Chest: Effort normal and breath sounds normal.  Abdominal: Soft. Bowel sounds are normal. He exhibits no distension. There is no tenderness.  Musculoskeletal: Normal range of motion.  Neurological: He is alert.  Skin: Skin is warm. Rash noted. There is erythema.     Psychiatric: He has a normal mood and affect. Thought content normal.     Lab Results  Component Value Date   WBC 8.6 12/22/2009   HGB 14.9 12/22/2009   HCT 45.4 12/22/2009   MCV 84.7 12/22/2009   PLT 306 12/22/2009   Lab Results  Component Value Date   CREATININE 0.84 12/22/2009   BUN 13 12/22/2009   NA 137 12/22/2009   K 4.6 12/22/2009   CL 100 12/22/2009   CO2 28 12/22/2009    No results found for this basename: HGBA1C   Lipid Panel     Component Value Date/Time   CHOL 193 01/04/2010  2111   TRIG 83 01/04/2010 2111   HDL 47 01/04/2010 2111   CHOLHDL 4.1 Ratio 01/04/2010 2111   VLDL 17 01/04/2010 2111   LDLCALC 129* 01/04/2010 2111       Assessment and plan:   [name] was seen today for annual exam and hypothyroidism.  Diagnoses and associated orders for this visit:  Annual physical exam - Urinalysis Dipstick - TSH - COMPLETE METABOLIC PANEL WITH GFR  Throat fullness - CT Soft Tissue Neck W Contrast; Future  Tinea cruris - clotrimazole (LOTRIMIN) 1 % cream; Apply 1 application topically 2 (two) times daily.         Holland CommonsKECK, Naren Benally, NP-C [facility] [phone] 08/05/2013, 5:17 PM

## 2013-08-05 NOTE — Progress Notes (Signed)
Patient presents for PE States he feels occasional warmth at front of neck for 2 months Concerned it might be related to thyroid issues C/O occasional headaches that he thinks may be due to working in the sun Also, c/o "worrying sometimes." C/O bilateral wrist and hand pain for 3 years. Plays weekly baseball and thinks it may be related to this

## 2013-08-06 ENCOUNTER — Telehealth: Payer: Self-pay | Admitting: Emergency Medicine

## 2013-08-06 LAB — COMPLETE METABOLIC PANEL WITH GFR
ALT: 32 U/L (ref 0–53)
AST: 31 U/L (ref 0–37)
Albumin: 4.3 g/dL (ref 3.5–5.2)
Alkaline Phosphatase: 74 U/L (ref 39–117)
BUN: 18 mg/dL (ref 6–23)
CO2: 28 mEq/L (ref 19–32)
Calcium: 9.2 mg/dL (ref 8.4–10.5)
Chloride: 102 mEq/L (ref 96–112)
Creat: 0.84 mg/dL (ref 0.50–1.35)
GFR, Est African American: >89 mL/min
GFR, Est Non African American: >89 mL/min
Glucose, Bld: 74 mg/dL (ref 70–99)
Potassium: 4.4 mEq/L (ref 3.5–5.3)
Sodium: 138 mEq/L (ref 135–145)
Total Bilirubin: 0.4 mg/dL (ref 0.2–1.2)
Total Protein: 7.3 g/dL (ref 6.0–8.3)

## 2013-08-06 LAB — TSH: TSH: 3.786 u[IU]/mL (ref 0.350–4.500)

## 2013-08-06 NOTE — Telephone Encounter (Signed)
Left message for pt to call for scheduled CT results @ [facility] 08/10/13 @ 445 pm arrival.

## 2013-08-10 ENCOUNTER — Encounter (HOSPITAL_COMMUNITY): Payer: Self-pay

## 2013-08-10 ENCOUNTER — Ambulatory Visit (HOSPITAL_COMMUNITY)
Admission: RE | Admit: 2013-08-10 | Discharge: 2013-08-10 | Disposition: A | Discharge status: Home / Self Care | Payer: Self-pay | Source: Ambulatory Visit | Attending: Internal Medicine | Admitting: Internal Medicine

## 2013-08-10 DIAGNOSIS — J3489 Other specified disorders of nose and nasal sinuses: Secondary | ICD-10-CM | POA: Insufficient documentation

## 2013-08-10 DIAGNOSIS — R6889 Other general symptoms and signs: Secondary | ICD-10-CM

## 2013-08-10 DIAGNOSIS — R0989 Other specified symptoms and signs involving the circulatory and respiratory systems: Secondary | ICD-10-CM

## 2013-08-10 MED ORDER — IOHEXOL 300 MG/ML  SOLN
100.0000 mL | Freq: Once | INTRAMUSCULAR | Status: AC | PRN
  Administered 2013-08-10: 100 mL via INTRAVENOUS

## 2013-08-31 ENCOUNTER — Telehealth: Payer: Self-pay | Admitting: *Deleted

## 2013-08-31 NOTE — Telephone Encounter (Signed)
Message copied by Fredderick SeveranceUCATTE, Aileen Amore L on Tue Aug 31, 2013  5:03 PM ------      Message from: Holland CommonsKECK, VALERIE A      Created: Tue Aug 10, 2013 11:11 PM       Labs wnl. ------

## 2013-08-31 NOTE — Telephone Encounter (Signed)
Message copied by Fredderick SeveranceUCATTE, LAURENZE L on Tue Aug 31, 2013  4:28 PM ------      Message from: Holland CommonsKECK, VALERIE A      Created: Tue Aug 10, 2013 11:11 PM       Labs wnl. ------

## 2013-08-31 NOTE — Telephone Encounter (Signed)
Patient notified of normal lab results. Provider reviewed patient's ultrasound as we discussed lab results  and patient notified that ultrasound was normal. Patient denies any sinus pain or pressure.

## 2013-08-31 NOTE — Telephone Encounter (Signed)
Left message on Patient's VM to return call to discuss lab results.

## 2014-02-25 ENCOUNTER — Ambulatory Visit: Payer: Self-pay | Attending: Internal Medicine

## 2014-10-03 ENCOUNTER — Other Ambulatory Visit: Payer: Self-pay | Admitting: Internal Medicine

## 2014-10-05 ENCOUNTER — Ambulatory Visit: Payer: Self-pay | Attending: Internal Medicine

## 2014-10-07 ENCOUNTER — Encounter: Payer: Self-pay | Admitting: Internal Medicine

## 2014-10-07 ENCOUNTER — Ambulatory Visit: Payer: Self-pay | Attending: Internal Medicine | Admitting: Internal Medicine

## 2014-10-07 VITALS — BP 138/82 | HR 53 | Temp 98.0°F | Resp 16 | Ht 68.0 in | Wt 201.4 lb

## 2014-10-07 DIAGNOSIS — E018 Other iodine-deficiency related thyroid disorders and allied conditions: Secondary | ICD-10-CM

## 2014-10-07 DIAGNOSIS — Z79899 Other long term (current) drug therapy: Secondary | ICD-10-CM | POA: Insufficient documentation

## 2014-10-07 DIAGNOSIS — E890 Postprocedural hypothyroidism: Secondary | ICD-10-CM | POA: Insufficient documentation

## 2014-10-07 DIAGNOSIS — Z87891 Personal history of nicotine dependence: Secondary | ICD-10-CM | POA: Insufficient documentation

## 2014-10-07 LAB — COMPLETE METABOLIC PANEL WITH GFR
ALT: 38 U/L (ref 9–46)
AST: 37 U/L (ref 10–40)
Albumin: 4.2 g/dL (ref 3.6–5.1)
Alkaline Phosphatase: 70 U/L (ref 40–115)
BUN: 16 mg/dL (ref 7–25)
CHLORIDE: 104 mmol/L (ref 98–110)
CO2: 27 mmol/L (ref 20–31)
CREATININE: 0.81 mg/dL (ref 0.60–1.35)
Calcium: 8.8 mg/dL (ref 8.6–10.3)
GFR, Est African American: >89 mL/min (ref >=60)
GFR, Est Non African American: >89 mL/min (ref >=60)
Glucose, Bld: 86 mg/dL (ref 65–99)
Potassium: 3.7 mmol/L (ref 3.5–5.3)
Sodium: 139 mmol/L (ref 135–146)
Total Bilirubin: 0.4 mg/dL (ref 0.2–1.2)
Total Protein: 6.9 g/dL (ref 6.1–8.1)

## 2014-10-07 LAB — TSH: TSH: 5.116 u[IU]/mL — AB (ref 0.350–4.500)

## 2014-10-07 LAB — T4, FREE: FREE T4: 1.17 ng/dL (ref 0.80–1.80)

## 2014-10-07 NOTE — Progress Notes (Signed)
QMV:[id number]  [id number]:[id number]  DOB - Jan 26, 1975  CC:  Chief Complaint  Patient presents with  . Follow-up       HPI: Daniel Fuller is a 39 y.o. male for routine follow up for hypothyroidism - post radioactive iodine. Patient is taking levothyroxine and has had stable TSH results in the past. TSH level and Free T4 level to be checked today.  Patient has No headache, No chest pain, No abdominal pain - No Nausea, No new weakness tingling or numbness, No Cough - SOB. 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 Patient states he drinks [employer] while working to stay energized.  No Known Allergies Past Medical History  Diagnosis Date  . Thyroid disease    Current Outpatient Prescriptions on File Prior to Visit  Medication Sig Dispense Refill  . levothyroxine (SYNTHROID) 125 MCG tablet Take 1 tablet (125 mcg total) by mouth daily before breakfast. 90 tablet 0  . loratadine (CLARITIN) 10 MG tablet Take 1 tablet (10 mg total) by mouth daily. 30 tablet 1   No current facility-administered medications on file prior to visit.   History reviewed. No pertinent family history. Social History   Social History  . Marital Status: Married    Spouse Name: N/A  . Number of Children: N/A  . Years of Education: N/A   Occupational History  . Not on file.   Social History Main Topics  . Smoking status: Former Smoker -- 0.40 packs/day for 6 years    Types: Cigarettes    Start date: 08/06/1994    Quit date: 08/05/2000  . Smokeless tobacco: Not on file  . Alcohol Use: Not on file  . Drug Use: Not on file  . Sexual Activity: Not on file   Other Topics Concern  . Not on file   Social History Narrative    Review of Systems: Other than what is stated in HPI, all other systems are negative.   Objective:   Filed Vitals:   10/07/14 1705  BP: 138/82  Pulse: 53  Temp: 98 F (36.7 C)  Resp:  16    Physical Exam  Constitutional: He is oriented to person, place, and time.  Cardiovascular: Normal rate, regular rhythm and normal heart sounds.   Pulmonary/Chest: Effort normal and breath sounds normal.  Neurological: He is alert and oriented to person, place, and time.     Lab Results  Component Value Date   WBC 8.6 12/22/2009   HGB 14.9 12/22/2009   HCT 45.4 12/22/2009   MCV 84.7 12/22/2009   PLT 306 12/22/2009   Lab Results  Component Value Date   CREATININE 0.84 08/05/2013   BUN 18 08/05/2013   NA 138 08/05/2013   K 4.4 08/05/2013   CL 102 08/05/2013   CO2 28 08/05/2013    No results found for: HGBA1C Lipid Panel     Component Value Date/Time   CHOL 193 01/04/2010 2111   TRIG 83 01/04/2010 2111   HDL 47 01/04/2010 2111   CHOLHDL 4.1 Ratio 01/04/2010 2111   VLDL 17 01/04/2010 2111   LDLCALC 129* 01/04/2010 2111       Assessment and plan:   Sanav was seen today for follow-up.  Diagnoses and all orders for this visit:  HYPOTHYROIDISM, POST-RADIOACTIVE IODINE -     TSH -     COMPLETE METABOLIC PANEL WITH GFR -     T4, Free Patient  medication will be adjusted if necessary pending results and refill 90 days at a time with 2 refills Refills of medication will be sent to [facility] pharmacy at [facility] once lab results have been reviewed. The patient verbalized understanding. Patient advised on effects of high caffeine intake, advised to drink more water  Patient to return for follow up in 6 months March 2017    Stephanie Coup, RN, BSN, [facility] [place] 10/07/2014 5:30 PM

## 2014-10-07 NOTE — Progress Notes (Signed)
Patient here for routine visit for his thyroid disease Patient is requesting his RX be for three months No complaints today

## 2014-10-11 ENCOUNTER — Telehealth: Payer: Self-pay | Admitting: *Deleted

## 2014-10-11 DIAGNOSIS — E018 Other iodine-deficiency related thyroid disorders and allied conditions: Secondary | ICD-10-CM

## 2014-10-11 DIAGNOSIS — E038 Other specified hypothyroidism: Secondary | ICD-10-CM

## 2014-10-11 MED ORDER — LEVOTHYROXINE SODIUM 125 MCG PO TABS: 125.0000 ug | ORAL_TABLET | Freq: Every day | ORAL | Status: DC

## 2014-10-11 NOTE — Telephone Encounter (Signed)
-----   Message from Ambrose Finland, NP sent at 10/11/2014 11:04 AM EDT ----- Make sure patient has not been out of synthroid before I make any changes to his medication. If he has not ben out I would like him to come back in 2 months for a repeat and have him continue on current dose for right now. Please place future TSH level

## 2014-10-11 NOTE — Telephone Encounter (Signed)
Patient verified DOB Patient states he has been taking synthroid as prescribed. Patient states he has 6/7 tablets left. Medical assistant will refill the patients synthroid to carry him through the two month recheck. Patient questioned if a finding was made on his labs from Friday. Medical assistant stated the doctor made no note of findings. Provider wants to monitor synthroid as prescribed and place future order for TSH. Patient had no further question.

## 2014-12-22 ENCOUNTER — Telehealth: Payer: Self-pay | Admitting: Internal Medicine

## 2014-12-22 DIAGNOSIS — E038 Other specified hypothyroidism: Secondary | ICD-10-CM

## 2014-12-22 MED ORDER — LEVOTHYROXINE SODIUM 125 MCG PO TABS: 125.0000 ug | ORAL_TABLET | Freq: Every day | ORAL | Status: DC

## 2014-12-22 NOTE — Telephone Encounter (Signed)
Patient came in requesting a medication refill for synthroid. Please follow up.

## 2015-04-04 ENCOUNTER — Telehealth: Payer: Self-pay | Admitting: Internal Medicine

## 2015-04-04 DIAGNOSIS — E038 Other specified hypothyroidism: Secondary | ICD-10-CM

## 2015-04-04 MED ORDER — LEVOTHYROXINE SODIUM 125 MCG PO TABS: 125.0000 ug | ORAL_TABLET | Freq: Every day | ORAL | Status: DC

## 2015-04-04 NOTE — Telephone Encounter (Signed)
Pt. Came into facility requesting a med refill on levothyroxine (SYNTHROID) 125 MCG tablet. Please f/u with pt.

## 2015-07-11 ENCOUNTER — Ambulatory Visit: Payer: Self-pay | Attending: Internal Medicine | Admitting: Internal Medicine

## 2015-07-11 VITALS — BP 136/73 | HR 66 | Temp 98.1°F | Resp 16 | Ht 66.0 in | Wt 205.0 lb

## 2015-07-11 DIAGNOSIS — E018 Other iodine-deficiency related thyroid disorders and allied conditions: Secondary | ICD-10-CM

## 2015-07-11 DIAGNOSIS — Z131 Encounter for screening for diabetes mellitus: Secondary | ICD-10-CM

## 2015-07-11 DIAGNOSIS — M722 Plantar fascial fibromatosis: Secondary | ICD-10-CM

## 2015-07-11 LAB — CBC WITH DIFFERENTIAL/PLATELET
BASOS PCT: 0 %
Basophils Absolute: 0 cells/uL (ref 0–200)
EOS PCT: 2 %
Eosinophils Absolute: 176 cells/uL (ref 15–500)
HCT: 41.5 % (ref 38.5–50.0)
HEMOGLOBIN: 14.2 g/dL (ref 13.2–17.1)
LYMPHS ABS: 1848 {cells}/uL (ref 850–3900)
Lymphocytes Relative: 21 %
MCH: 28.3 pg (ref 27.0–33.0)
MCHC: 34.2 g/dL (ref 32.0–36.0)
MCV: 82.8 fL (ref 80.0–100.0)
MONOS PCT: 8 %
MPV: 9.7 fL (ref 7.5–12.5)
Monocytes Absolute: 704 cells/uL (ref 200–950)
NEUTROS ABS: 6072 {cells}/uL (ref 1500–7800)
Neutrophils Relative %: 69 %
PLATELETS: 292 10*3/uL (ref 140–400)
RBC: 5.01 MIL/uL (ref 4.20–5.80)
RDW: 13.4 % (ref 11.0–15.0)
WBC: 8.8 10*3/uL (ref 3.8–10.8)

## 2015-07-11 LAB — T4, FREE: Free T4: 1.4 ng/dL (ref 0.8–1.8)

## 2015-07-11 LAB — TSH: TSH: 5.19 mIU/L — ABNORMAL HIGH (ref 0.40–4.50)

## 2015-07-11 LAB — POCT GLYCOSYLATED HEMOGLOBIN (HGB A1C): HEMOGLOBIN A1C: 5.2

## 2015-07-11 NOTE — Progress Notes (Signed)
Daniel Fuller, is a 40 y.o. male  [id number]  [id number]  DOB - 1975-05-15  Chief Complaint  Patient presents with  . Hypothyroidism    c/o bilateral foot pain        Subjective:   Daniel Fuller is a 40 y.o. male here today for a follow up visit, for f/u of hypothyroidism.  He has been doing well, but does c/o of of bilateral heal pain after standing on his feet for long time or when outside playing baseball.  He bought some foot sole inserts lately, but has not used.  Drinks occasional non -etoh alcohol; denies smoking.  Asked me if should take centrum men silver mvi   Patient has No headache, No chest pain, No abdominal pain - No Nausea, No new weakness tingling or numbness, No Cough - SOB.  No problems updated.  ALLERGIES: No Known Allergies  PAST MEDICAL HISTORY: Past Medical History  Diagnosis Date  . Thyroid disease     MEDICATIONS AT HOME: Prior to Admission medications   Medication Sig Start Date End Date Taking? Authorizing Provider  levothyroxine (SYNTHROID) 125 MCG tablet Take 1 tablet (125 mcg total) by mouth daily before breakfast. 04/04/15  Yes Ambrose FinlandValerie A Keck, NP  levothyroxine (SYNTHROID, LEVOTHROID) 125 MCG tablet TAKE ONE TABLET BY MOUTH ONCE DAILY BEFORE BREAKFAST 12/20/14   Quentin Angstlugbemiga E Jegede, MD  loratadine (CLARITIN) 10 MG tablet Take 1 tablet (10 mg total) by mouth daily. Patient not taking: Reported on 07/11/2015 05/28/12   Ripudeep Jenna LuoK Rai, MD     Objective:   Filed Vitals:   07/11/15 1417  BP: 136/73  Pulse: 66  Temp: 98.1 F (36.7 C)  TempSrc: Oral  Resp: 16  Height: 5\' 6"  (1.676 m)  Weight: 205 lb (92.987 kg)  SpO2: 98%    Exam General appearance : Awake, alert, not in any distress. Speech Clear. Not toxic looking, healthy appearing. Pleasant. HEENT: Atraumatic and Normocephalic, pupils equally reactive to light. Neck: supple, no JVD. No cervical lymphadenopathy.  Chest:Good air entry bilaterally,  no added sounds. CVS: S1 S2 regular, no murmurs/gallups or rubs. Abdomen: Bowel sounds active, Non tender and not distended with no gaurding, rigidity or rebound. Extremities: B/L Lower Ext shows no edema, both legs are warm to touch Feet bilat; 2+ periphn pulses, sensation intact, no pain on palpation.  Neurology: Awake alert, and oriented X 3, CN II-XII grossly intact, Non focal Skin:No Rash  Data Review No results found for: HGBA1C  Dep[facility] 07/11/2015 08/05/2013  Decreased Interest 0 0  Down, Depressed, Hopeless 0 0  PHQ - 2 Score 0 0      Assessment & Plan   1. HYPOTHYROIDISM, POST-RADIOACTIVE IODINE - BASIC METABOLIC PANEL WITH GFR - TSH - T4, Free - CBC with Differential - Lipid Panel; Future - has only few days left of synthroid, will renew or adjust synthroid based on following labs.  2. Plantar fasciitis, bilateral - info given, recd rest/RICE, sole inserts use - info given.  3. Diabetes mellitus screening - HgB A1c   4. Dark skined from sun exposure, out in sun a lot, Recd sunscreen.  5. mvi - given his health status and good po intake, do not think he needs mvi, but certainly welcome to take if wants.  Patient have been counseled extensively about nutrition and exercise  Return in about 3 months (around 10/11/2015).  The patient was given clear instructions to go to ER or return to medical center if  symptoms don't improve, worsen or new problems develop. The patient verbalized understanding. The patient was told to call to get lab results if they haven't heard anything in the next week.   This note has been created with Education officer, environmentalDragon speech recognition software and smart phrase technology. Any transcriptional errors are unintentional.   Pete Glatterawn T Langeland, MD, MBA/MHA [facility] [place], [place] [phone]   07/11/2015, 2:38 PM

## 2015-07-11 NOTE — Patient Instructions (Addendum)
Fascitis plantar (Plantar Fasciitis) La fascitis plantar es una afeccin dolorosa que se produce en el taln. Ocurre cuando la banda de tejido que conecta los dedos con el hueso del taln (fascia plantar) se irrita. Esto puede ocurrir despus de hacer mucho ejercicio u otras actividades repetitivas (lesin por uso excesivo). El dolor de la fascitis plantar puede ser de leve (irritacin) a intenso, y en los casos ms agudos puede dificultar que la persona camine o se mueva. Por lo general, el dolor es peor a la maana o despus de permanecer sentado o acostado durante un perodo. CAUSAS Este trastorno puede ser causado por:  Estar de pie durante largos perodos.  Usar zapatos que no calcen bien.  Practicar actividades de alto impacto, como correr, o hacer ejercicios aerbicos o ballet.  Tener sobrepeso.  Tener una forma de caminar (marcha) anormal.  Tener los msculos de la pantorrilla tensos.  Tener el arco alto en los pies.  Comenzar una nueva actividad fsica. SNTOMAS El sntoma principal de esta afeccin es el dolor en el taln. Otros sntomas pueden ser los siguientes:  Dolor que empeora despus de una actividad o un ejercicio.  Dolor ms intenso a la maana o despus de descansar.  Dolor que desaparece despus de caminar durante unos minutos. DIAGNSTICO Esta afeccin se puede diagnosticar en funcin de los signos y los sntomas. El mdico tambin le realizar un examen fsico para controlar si tiene lo siguiente:  Un rea dolorida en la parte inferior del pie.  El arco alto.  Dolor al mover el pie.  Dificultad para mover el pie. Tambin puede necesitar estudios por imgenes para confirmar el diagnstico. Estos pueden incluir los siguientes:  Radiografas.  Ecografa.  Resonancia magntica. TRATAMIENTO  El tratamiento de la fascitis plantar depende de la gravedad de la afeccin. El tratamiento puede incluir lo siguiente:  Reposo, hielo y analgsicos de venta  libre para controlar el dolor.  Ejercicios para estirar las pantorrillas y la fascia plantar.  Una frula que mantiene el pie estirado y hacia arriba mientras usted duerme (frula nocturna).  Fisioterapia para aliviar los sntomas y evitar problemas en el futuro.  Inyecciones de cortisona para aliviar el dolor intenso.  Tratamiento con ondas de choque extracorpreas para estimular con impulsos elctricos la fascia plantar lesionada. Esto suele usarse como un ltimo recurso antes de la ciruga.  Ciruga, si los otros tratamientos no han funcionado despus de 12meses. INSTRUCCIONES PARA EL CUIDADO EN EL HOGAR  Tome los medicamentos solamente como se lo haya indicado el mdico.  Evite las actividades que causan dolor.  Frote la parte inferior del pie sobre una bolsa de hielo o una botella de agua fra. Haga esto durante 20minutos, de 3a 4veces al da.  Realice estiramientos simples como se lo haya indicado el mdico.  Trate de usar calzado deportivo con amortiguacin de aire o gel, o plantillas blandas.  Si el mdico se lo ha indicado, use una frula nocturna para dormir.  Cumpla con todas las visitas de control, segn le indique su mdico. PREVENCIN   No realice ejercicios ni actividades que le causen dolor en el taln.  Considere la posibilidad de empezar actividades de bajo impacto si sigue teniendo problemas.  Pierda peso si lo necesita. La mejor forma de prevenir la fascitis plantar es evitar las actividades que lesionan ms la fascia plantar. SOLICITE ATENCIN MDICA SI:  Los sntomas no desaparecen despus del tratamiento en su casa.  El dolor empeora.  El dolor afecta la capacidad de   moverse o de realizar las actividades diarias.   Esta informacin no tiene como fin reemplazar el consejo del mdico. Asegrese de hacerle al mdico cualquier pregunta que tenga.   Document Released: 10/10/2004 Document Revised: 05/17/2014 Elsevier Interactive Patient Education  2016 [employer].  

## 2015-07-12 ENCOUNTER — Other Ambulatory Visit: Payer: Self-pay | Admitting: Internal Medicine

## 2015-07-12 LAB — BASIC METABOLIC PANEL WITH GFR
BUN: 10 mg/dL (ref 7–25)
CALCIUM: 9.6 mg/dL (ref 8.6–10.3)
CO2: 26 mmol/L (ref 20–31)
Chloride: 104 mmol/L (ref 98–110)
Creat: 0.82 mg/dL (ref 0.60–1.35)
GFR, Est Non African American: >89 mL/min (ref >=60)
GLUCOSE: 83 mg/dL (ref 65–99)
Potassium: 3.7 mmol/L (ref 3.5–5.3)
Sodium: 141 mmol/L (ref 135–146)

## 2015-07-12 MED ORDER — LEVOTHYROXINE SODIUM 150 MCG PO TABS: 150.0000 ug | ORAL_TABLET | Freq: Every day | ORAL | Status: DC

## 2016-03-19 ENCOUNTER — Ambulatory Visit: Payer: Self-pay | Attending: Internal Medicine | Admitting: Internal Medicine

## 2016-03-19 VITALS — BP 131/78 | HR 70 | Temp 98.5°F | Resp 16 | Wt 201.2 lb

## 2016-03-19 DIAGNOSIS — E890 Postprocedural hypothyroidism: Secondary | ICD-10-CM | POA: Insufficient documentation

## 2016-03-19 DIAGNOSIS — M545 Low back pain, unspecified: Secondary | ICD-10-CM

## 2016-03-19 DIAGNOSIS — E018 Other iodine-deficiency related thyroid disorders and allied conditions: Secondary | ICD-10-CM

## 2016-03-19 DIAGNOSIS — K219 Gastro-esophageal reflux disease without esophagitis: Secondary | ICD-10-CM

## 2016-03-19 DIAGNOSIS — R109 Unspecified abdominal pain: Secondary | ICD-10-CM | POA: Insufficient documentation

## 2016-03-19 DIAGNOSIS — Z13220 Encounter for screening for lipoid disorders: Secondary | ICD-10-CM | POA: Insufficient documentation

## 2016-03-19 DIAGNOSIS — Z114 Encounter for screening for human immunodeficiency virus [HIV]: Secondary | ICD-10-CM

## 2016-03-19 DIAGNOSIS — Z79899 Other long term (current) drug therapy: Secondary | ICD-10-CM | POA: Insufficient documentation

## 2016-03-19 DIAGNOSIS — R300 Dysuria: Secondary | ICD-10-CM | POA: Insufficient documentation

## 2016-03-19 LAB — CMP AND LIVER
ALBUMIN: 4.3 g/dL (ref 3.6–5.1)
ALK PHOS: 65 U/L (ref 40–115)
ALT: 31 U/L (ref 9–46)
AST: 26 U/L (ref 10–40)
BILIRUBIN DIRECT: 0.1 mg/dL (ref <=0.2)
BILIRUBIN TOTAL: 0.5 mg/dL (ref 0.2–1.2)
BUN: 13 mg/dL (ref 7–25)
CO2: 26 mmol/L (ref 20–31)
Calcium: 9.1 mg/dL (ref 8.6–10.3)
Chloride: 103 mmol/L (ref 98–110)
Creat: 0.81 mg/dL (ref 0.60–1.35)
Glucose, Bld: 88 mg/dL (ref 65–99)
Indirect Bilirubin: 0.4 mg/dL (ref 0.2–1.2)
POTASSIUM: 4 mmol/L (ref 3.5–5.3)
Sodium: 138 mmol/L (ref 135–146)
TOTAL PROTEIN: 7 g/dL (ref 6.1–8.1)

## 2016-03-19 LAB — CBC WITH DIFFERENTIAL/PLATELET
BASOS ABS: 0 {cells}/uL (ref 0–200)
Basophils Relative: 0 %
EOS ABS: 69 {cells}/uL (ref 15–500)
EOS PCT: 1 %
HCT: 42.8 % (ref 38.5–50.0)
Hemoglobin: 14.7 g/dL (ref 13.2–17.1)
Lymphocytes Relative: 26 %
Lymphs Abs: 1794 cells/uL (ref 850–3900)
MCH: 28.6 pg (ref 27.0–33.0)
MCHC: 34.3 g/dL (ref 32.0–36.0)
MCV: 83.3 fL (ref 80.0–100.0)
MONOS PCT: 6 %
MPV: 9.2 fL (ref 7.5–12.5)
Monocytes Absolute: 414 cells/uL (ref 200–950)
NEUTROS ABS: 4623 {cells}/uL (ref 1500–7800)
NEUTROS PCT: 67 %
PLATELETS: 275 10*3/uL (ref 140–400)
RBC: 5.14 MIL/uL (ref 4.20–5.80)
RDW: 13.5 % (ref 11.0–15.0)
WBC: 6.9 10*3/uL (ref 3.8–10.8)

## 2016-03-19 LAB — POCT URINALYSIS DIPSTICK
BILIRUBIN UA: NEGATIVE
Blood, UA: NEGATIVE
GLUCOSE UA: NEGATIVE
KETONES UA: NEGATIVE
LEUKOCYTES UA: NEGATIVE
NITRITE UA: NEGATIVE
Protein, UA: NEGATIVE
Spec Grav, UA: 1.02
Urobilinogen, UA: 0.2
pH, UA: 7

## 2016-03-19 LAB — LIPID PANEL
CHOL/HDL RATIO: 4.2 ratio (ref <5.0)
Cholesterol: 164 mg/dL (ref <200)
HDL: 39 mg/dL — ABNORMAL LOW (ref >40)
LDL Cholesterol: 104 mg/dL — ABNORMAL HIGH (ref <100)
TRIGLYCERIDES: 107 mg/dL (ref <150)
VLDL: 21 mg/dL (ref <30)

## 2016-03-19 LAB — TSH: TSH: 0.91 mIU/L (ref 0.40–4.50)

## 2016-03-19 LAB — T4, FREE: FREE T4: 1.4 ng/dL (ref 0.8–1.8)

## 2016-03-19 LAB — HIV ANTIBODY (ROUTINE TESTING W REFLEX): HIV 1&2 Ab, 4th Generation: NONREACTIVE

## 2016-03-19 MED ORDER — DICLOFENAC SODIUM 1 % TD GEL: 2.0000 g | Freq: Four times a day (QID) | TRANSDERMAL | 2 refills | Status: DC

## 2016-03-19 MED ORDER — PANTOPRAZOLE SODIUM 40 MG PO TBEC
40.0000 mg | DELAYED_RELEASE_TABLET | Freq: Every day | ORAL | 3 refills | Status: DC
Start: 2016-03-19 — End: 2016-05-06

## 2016-03-19 NOTE — Patient Instructions (Signed)
Financial aid packet  -   Dolor de espalda en adultos (Back Pain, Adult) El dolor de espalda es muy frecuente. A menudo mejora con el tiempo. La causa del dolor de espalda generalmente no es peligrosa. La Harley-Davidsonmayora de las personas puede aprender a Runner, broadcasting/film/videomanejar el dolor de espalda por s mismas. CUIDADOS EN EL HOGAR Controle su dolor de espalda a fin de Public house managerdetectar algn cambio. Las siguientes indicaciones ayudarn a Psychologist, clinicalaliviar cualquier dolor que pueda sentir:  Materials engineerMantngase activo. Comience con caminatas cortas sobre superficies planas si es posible. Trate de caminar un poco ms cada da.  Haga ejercicios con regularidad tal como le indic el mdico. El ejercicio ayuda a que su espalda se cure ms rpidamente. Tambin ayuda a prevenir futuras lesiones al Kimberly-Clarkmantener los msculos fuertes y flexibles.  No se siente, conduzca ni permanezca de pie durante ms de 30 minutos.  No permanezca en la cama. Si hace reposo ms de 1 a 2 das, puede demorar su recuperacin.  Sea cuidadoso al inclinarse o levantar un objeto. Use una tcnica apropiada para levantar peso:  Flexione las rodillas.  Mantenga el objeto cerca del cuerpo.  No gire.  Duerma sobre un NVR Inccolchn firme. Recustese sobre un costado y flexione las rodillas. Si se recuesta Fisher Scientificsobre la espalda, coloque una almohada debajo de las rodillas.  Tome los medicamentos solamente como se lo haya indicado el mdico.  Aplique hielo sobre la zona lesionada.  Ponga el hielo en una bolsa plstica.  Coloque una toalla entre la piel y la bolsa de hielo.  Deje el hielo durante 20minutos, 2 a 3veces por da, durante los primeros 2 o 3das. Despus de eso, puede alternar entre compresas de hielo y Airline pilotcalor.  Evite sentir ansiedad o estrs. Encuentre maneras efectivas de lidiar con el estrs, Surveyor, miningcomo hacer ejercicio.  Mantenga un peso saludable. El peso excesivo ejerce tensin sobre la espalda. SOLICITE AYUDA SI:  Siente dolor que no se alivia con reposo o  medicamentos.  Siente cada vez ms dolor que se extiende a las piernas o los glteos.  El dolor no mejora en una semana.  Siente dolor por la noche.  Pierde peso.  Siente escalofros o fiebre. SOLICITE AYUDA DE INMEDIATO SI:  No puede controlar su materia fecal (heces) o el pis (orina).  Siente debilidad en las piernas o los brazos.  Siente prdida de la sensibilidad (adormecimiento) en las piernas o los brazos.  Tiene malestar estomacal (nuseas) o vomita.  Siente dolor de estmago (abdominal).  Siente que se desvanece (se desmaya). Esta informacin no tiene Theme park managercomo fin reemplazar el consejo del mdico. Asegrese de hacerle al mdico cualquier pregunta que tenga. Document Released: 07/16/2010 Document Revised: 01/21/2014 Document Reviewed: 05/04/2013 Elsevier Interactive Patient Education  2017 ArvinMeritorElsevier Inc.  -   Ejercicios para la espalda (Back Exercises) Si tiene dolor de espalda, haga estos ejercicios 2 o 3veces por da, o como se lo haya indicado el mdico. Cuando el dolor desaparezca, hgalos una vez por da, pero haga ms repeticiones de cada ejercicio. Si no le duele la espalda, haga estos ejercicios una vez por da o como se lo haya indicado el mdico. EJERCICIOS Rodilla al pecho Repita estos pasos 3 o 5veces seguidas con cada pierna: 1. Acustese boca arriba sobre una cama dura o sobre el suelo con las piernas extendidas. 2. Lleve una rodilla al pecho. 3. Mantenga la rodilla contra el pecho. Para lograrlo tmese la rodilla o el muslo. 4. Tire de la rodilla hasta sentir una elongacin Levi Strausssuave  en la parte baja de la espalda. 5. Mantenga la elongacin durante 10 a 30segundos. 6. Suelte y extienda la pierna lentamente. Inclinacin de la pelvis Repita estos pasos 5 o 10veces seguidas: 1. Acustese boca arriba sobre una cama dura o sobre el suelo con las piernas extendidas. 2. Flexione las rodillas de manera que apunten al techo. Los pies deben estar apoyados en el  suelo. 3. Contraiga los msculos de la parte baja del vientre (abdomen) para empujar la zona lumbar contra el suelo. Este movimiento har que el cccix apunte hacia el techo, en lugar de apuntar hacia abajo en direccin a los pies o al suelo. 4. Mantenga esta posicin durante 5 a 10segundos mientras contrae suavemente los msculos y respira con normalidad. El perro y el gato Repita estos pasos hasta que la zona lumbar se curve con ms facilidad: 1. Apoye las palmas de las manos y las rodillas sobre una superficie firme. Las manos deben estar alineadas con los hombros y las rodillas con las caderas. Puede colocarse almohadillas debajo de las rodillas. 2. Deje caer la cabeza y lleve el cccix hacia abajo de modo que apunte en direccin al suelo para que la zona lumbar se arquee como el lomo de un gato [place]. 3. Mantenga esta posicin durante 5segundos. 4. Lentamente, levante la cabeza y lleve el cccix hacia arriba de modo que apunte en direccin al techo para que la espalda se arquee (hunda) como el lomo de un perro contento. 5. Mantenga esta posicin durante 5segundos. Flexiones de brazos Repita estos pasos 5 o 10veces seguidas: 1. Acustese boca abajo en el suelo. 2. Ponga las manos cerca de la cabeza, separadas aproximadamente al ancho de los hombros. 3. Con la espalda relajada y las caderas apoyadas en el suelo, extienda lentamente los brazos para levantar la mitad superior del cuerpo y Optometrist los hombros. No use los msculos de la espalda. Para estar ms cmodo, puede cambiar la International Paper. 4. Mantenga esta posicin durante 5segundos. 5. Lentamente vuelva a la posicin horizontal. Puentes Repita estos pasos 10veces seguidas: 1. Acustese boca arriba sobre una superficie firme. 2. Flexione las rodillas de manera que apunten al techo. Los pies deben estar apoyados en el suelo. 3. Contraiga los glteos y despegue las nalgas del suelo hasta que la cintura est casi a la  altura de las rodillas. Si no siente el trabajo muscular en las nalgas y la parte posterior de los muslos, aleje los pies 1 o 2pulgadas (2,5 o 5centmetros) de las nalgas. 4. Mantenga esta posicin durante 3 a 5segundos. 5. Lentamente, vuelva a apoyar las nalgas en el suelo y relaje los glteos. Si este ejercicio le resulta muy fcil, intente realizarlo con los brazos cruzados [place]. Abdominales Repita estos pasos 5 o 10veces seguidas: 1. Acustese boca arriba sobre una cama dura o sobre el suelo con las piernas extendidas. 2. Flexione las rodillas de manera que apunten al techo. Los pies deben estar apoyados en el suelo. 3. Cruce los World Fuel Services Corporation. 4. Baje levemente el mentn en direccin al pecho, pero no doble el cuello. 5. Contraiga los msculos del abdomen y con lentitud eleve el pecho lo suficiente como para despegar levemente los omplatos del suelo. 6. Lentamente baje el pecho y la cabeza hasta el suelo. Elevaciones de espalda Repita estos pasos 5 o 10veces seguidas: 1. Acustese boca abajo con los brazos a los costados y apoye la frente en el suelo. 2. Contraiga los msculos de las piernas  y los glteos. 3. Lentamente despegue el pecho del suelo mientras mantiene las caderas apoyadas en el suelo. Mantenga la nuca alineada con la curvatura de la espalda. Mire hacia el suelo mientras hace este ejercicio. 4. Mantenga esta posicin durante 3 a 5segundos. 5. Lentamente baje el pecho y el rostro hasta el suelo. SOLICITE AYUDA SI:  El dolor de espalda se vuelve mucho ms intenso cuando hace un ejercicio.  El dolor de espalda no se [place] 2horas despus de ARAMARK Corporation ejercicios. Si tiene alguno de Limited Brands, deje de ARAMARK Corporation ejercicios. No vuelva a hacer los ejercicios a menos que el mdico lo autorice. SOLICITE AYUDA DE INMEDIATO SI:  Siente un dolor sbito y muy intenso en la espalda. Si esto ocurre, deje de Toys 'R[employer] Us. No vuelva a hacer los  ejercicios a menos que el mdico lo autorice. Esta informacin no tiene Theme park manager el consejo del mdico. Asegrese de hacerle al mdico cualquier pregunta que tenga. Document Released: 04/17/2010 Document Revised: 04/24/2015 Document Reviewed: 02/24/2014 Elsevier Interactive Patient Education  2017 Elsevier Inc.   -   Opciones de alimentos para pacientes con reflujo gastroesofgico - Adultos (Food Choices for Gastroesophageal Reflux Disease, Adult) Cuando se tiene reflujo gastroesofgico (ERGE), los alimentos que se ingieren y los hbitos de alimentacin son Engineer, production. Elegir los alimentos adecuados puede ayudar a Altria Group. QU PAUTAS DEBO SEGUIR?  Elija las frutas, los vegetales, los cereales integrales y los productos lcteos con bajo contenido de Serena.  Elija las carnes de [place], de pescado y de ave con bajo contenido de grasas.  Limite las grasas, [address] [place], los aderezos para [place], la [place], los frutos secos y Programme researcher, broadcasting/film/video.  Lleve un registro de alimentos. Esto ayuda a identificar los alimentos que ocasionan sntomas.  Evite los alimentos que le ocasionen sntomas. Pueden ser distintos para cada persona.  Haga comidas pequeas durante Glass blower/designer de 3 comidas abundantes.  Coma lentamente, en un lugar donde est distendido.  Limite el consumo de alimentos fritos.  Cocine los alimentos utilizando mtodos que no sean la fritura.  Evite el consumo alcohol.  Evite beber grandes cantidades de lquidos con las comidas.  Evite agacharse o recostarse hasta despus de 2 o 3horas de haber comido. QU ALIMENTOS NO SE RECOMIENDAN? Estos son algunos alimentos y bebidas que pueden empeorar los sntomas: Veterinary surgeon. Jugo de tomate. Salsa de tomate y espagueti. Ajes. [place] y [place]. Rbano picante. Frutas Naranjas, pomelos y limn (fruta y [place]). Carnes Carnes de [place], de pescado y de ave con gran contenido de grasas. Esto incluye  los perros calientes, las [place], el Central, la salchicha, el salame y el tocino. Lcteos Leche entera y [place]. PPG Industries. Crema. Mantequilla. Helados. Queso crema. Bebidas T o caf. Bebidas gaseosas o bebidas energizantes. Condimentos Salsa picante. Salsa barbacoa. Dulces/postres Chocolate y cacao. Rosquillas. Menta y mentol. Grasas y Du Pont. Esto incluye las papas fritas. Otros Vinagre. Especias picantes. Esto incluye la pimienta negra, la pimienta blanca, la pimienta roja, la pimienta de cayena, el curry en polvo, los clavos de [place], el jengibre y el [place] en polvo. Esta no es Raytheon de los alimentos y las bebidas que se Theatre stage manager. Comunquese con el nutricionista para recibir ms informacin. Esta informacin no tiene Theme park manager el consejo del mdico. Asegrese de hacerle al mdico cualquier pregunta que tenga. Document Released: 07/02/2011 Document Revised: 01/21/2014 Document Reviewed: 11/04/2012 Elsevier Interactive Patient Education  2017 Elsevier Inc.   -   Mantenimiento de Radiographer, therapeutic en los hombres (Health Maintenance, Male) Un estilo de vida saludable y los cuidados preventivos son importantes para la salud y Counsellor. Pregntele al mdico cul es el cronograma de exmenes peridicos adecuado para usted. QU DEBO SABER SOBRE EL PESO Y LA DIETA? Consuma una dieta saludable  Coma muchas verduras, frutas, cereales integrales, productos lcteos con bajo contenido de grasa y Associate Professor.  No consuma muchos alimentos de alto contenido de grasas slidas, azcares agregados o sal. Mantenga un peso saludable La actividad fsica habitual puede ayudarlo a Barista o mantener un peso saludable. Deber hacer lo siguiente:  Realizar al menos de actividad fsica por semana. El ejercicio debe aumentar la frecuencia cardaca y Development worker, international aid la transpiracin (ejercicio de [place]).  Hacer  ejercicios de entrenamiento de fuerza por lo [employer] por semana. Controlarse los niveles de colesterol y lpidos en la sangre  Hgase anlisis de sangre para controlar los lpidos y el colesterol cada 5aos a partir de los 35aos. Si tiene un riesgo alto de Warehouse manager cardiopatas coronarias, debe comenzar a Assurant de [place] a los [place]. Es posible que Insurance underwriter los niveles de colesterol con mayor frecuencia si:  Sus niveles de lpidos y colesterol son altos.  Es mayor de 50aos.  Tiene un riesgo alto de tener cardiopatas coronarias. QU DEBO SABER SOBRE LAS PRUEBAS DE DETECCIN DEL CNCER? Muchos tipos de cncer se pueden detectar de manera temprana y a menudo prevenirse. Cncer de pulmn  Debe someterse a pruebas de deteccin de cncer de pulmn todos los aos en los siguientes casos:  Si fuma actualmente y lo ha hecho durante por lo menos 30aos.  Si fue fumador que dej el hbito en el trmino de los ltimos 15aos.  Hable con el mdico sobre las opciones en relacin con los estudios de deteccin, cundo debe comenzar a Actuary y con Engineer, structural. Cncer colorrectal  Generalmente, las pruebas de deteccin habituales del cncer colorrectal comienzan a los 50aos y deben repetirse cada 5 a 10aos hasta los 75aos. Es posible que tenga que hacerse las pruebas con mayor frecuencia si se detectan formas tempranas de plipos precancerosos o pequeos bultos. Sin embargo, el mdico podr aconsejarle que lo haga antes, si tiene factores de riesgo para el cncer de colon.  El mdico puede recomendarle que use kits de prueba caseros para Recruitment consultant oculta en la materia fecal.  Se puede usar una pequea cmara en el extremo de un tubo para examinar el colon (sigmoidoscopia o colonoscopia). Este estudio PPG Industries formas ms tempranas de Building services engineer. Cncer de prstata y de testculo  En funcin de la edad y del [place] de salud general, el mdico  puede realizarle determinados estudios de deteccin del cncer de prstata y de testculo.  Hable con el mdico sobre cualquier sntoma o acerca de las inquietudes que tenga sobre el cncer de prstata o de testculo. Cncer de piel  Revise la piel de la cabeza a los pies con regularidad.  Informe al mdico si aparecen nuevos lunares o si nota cambios en los que ya tiene, especialmente en estos casos:  Si hay un cambio en el tamao, la forma o el color del lunar.  Si tiene un lunar que es ms grande que el tamao de una goma de Paramedic.  Siempre use pantalla solar. Aplquese pantalla solar de [place] y repetida a lo largo del Futures trader.  Use mangas y Hilton Hotels  largos, un sombrero de ala ancha y gafas para el sol cuando est al Guadalupe Banyan Goodchild, para protegerse. QU DEBO SABER SOBRE LAS CARDIOPATAS CORONARIAS, LA DIABETES Y LA HIPERTENSIN ARTERIAL?  Si usted tiene entre 18 y 39aos, debe medirse la presin arterial cada 3a 5aos. Si usted tiene 40aos o ms, debe medirse la presin arterial Allied Waste Industries. Debe medirse la presin arterial dos veces: una vez cuando est en un hospital o una clnica y la otra vez cuando est en otro sitio. Registre el promedio de Johnson Controls. Para controlar su presin arterial cuando no est en un hospital o Paulita Cradle, puede usar lo siguiente:  Neomia Dear mquina automtica para medir la presin arterial en una farmacia.  Un monitor para medir la presin arterial en el hogar.  Hable con el mdico Lowe's Companies ideales de la presin arterial.  Si tiene entre 45 y 79aos, consltele al mdico si debe tomar aspirina para evitar las cardiopatas coronarias.  Hgase anlisis habituales de deteccin de la diabetes; para ello, contrlese la glucemia en ayunas.  Si su peso es normal y tiene un bajo riesgo de padecer diabetes, realcese este anlisis cada tres aos despus de los 45aos.  Si tiene sobrepeso y un alto riesgo de padecer diabetes,  considere someterse a este anlisis antes o con mayor frecuencia.  Para los hombres que tienen entre 65 y 57aos, y son o han sido fumadores, se recomienda un nico estudio con ecografa para Engineer, manufacturing un aneurisma de aorta abdominal (AAA). QU DEBO SABER SOBRE LA PREVENCIN DE LAS INFECCIONES? HepatitisB Si tiene un riesgo ms alto de Primary school teacher hepatitis B, debe someterse a un examen de deteccin de este virus. Hable con el mdico para determinar si corre riesgo de tener una infeccin por hepatitisB. Hepatitis C Se recomienda un anlisis de [place] para:  Todos los que nacieron entre 1944 y [id number].  Todas las personas que tengan un riesgo de haber contrado hepatitis C. Enfermedades de transmisin sexual (ETS)  Debe realizarse pruebas de Airline pilot de las ETS todos los aos, incluidas la gonorrea y la clamidia, en estos casos:  Es sexualmente activo y es menor de [place].  Es mayor de 24aos, y Public affairs consultant informa que corre riesgo de tener este tipo de infecciones.  La actividad sexual ha cambiado desde que le hicieron la ltima prueba de deteccin y tiene un riesgo mayor de Warehouse manager clamidia o Copy. Pregntele al mdico si usted tiene riesgo.  Consulte a su mdico para saber si tiene un alto riesgo de infectarse por el VIH. El mdico puede recomendarle un medicamento de venta con receta para ayudar a evitar la infeccin por el VIH. QU MS PUEDO HACER?  Realcese los estudios de rutina de la salud, dentales y de Wellsite geologist.  Mantngase al da con las vacunas (inmunizaciones).  No consuma ningn producto que contenga tabaco, lo que incluye cigarrillos, tabaco de Theatre manager y Administrator, Civil Service. Si necesita ayuda para dejar de fumar, consulte al mdico.  Limite el consumo de alcohol a no ms de por da. BorgWarner a 12 onzas de cerveza, 5onzas de vino o 1onzas de bebidas alcohlicas de alta graduacin.  No consuma drogas.  No comparta agujas.  Solicite  ayuda a su mdico si necesita apoyo o informacin para abandonar las drogas.  Informe a su mdico si a menudo se siente deprimido.  Notifique a su mdico si alguna vez ha sido vctima de abuso o si no se siente seguro en su  hogar. Esta informacin no tiene Theme park manager el consejo del mdico. Asegrese de hacerle al mdico cualquier pregunta que tenga. Document Released: 06/29/2007 Document Revised: 01/21/2014 Document Reviewed: 10/04/2014 Elsevier Interactive Patient Education  2017 ArvinMeritor.

## 2016-03-19 NOTE — Progress Notes (Signed)
Daniel Fuller, is a 41 y.o. male  ZOX:[id number]  [id number]  DOB - [birth year]  Chief Complaint  Patient presents with  . Annual Exam        Subjective:   Daniel Fuller is a 41 y.o. male here today for a follow up visit of hypothyroidism. Last seen 07/10/16. Overall doing well to about 1 month ago, when started noticing more frequent pains in his bilat flank/back region, diffusely over his chest and abdomen as well.  No n/v/palpitations or current pains now.  Says sometimes pains related to foods he eats, but could not tell me exactly what foods.   He has been lifting and pushing a lot more heavier objects lately at work, but does not use waist belt currently.  Does not smoke or drink etoh, but does drink a lot of energy drinks, ie Monster drinks.  Also c/o of dysuria, no f/c though.  Denies an scrotal/penile lesions.  Denies sensation of incomplete bladder emptying or feeling of hesitancy.  Denies urinary or stool incontinence.  Patient has No headache, No chest pain, No abdominal pain - No Nausea, No new weakness tingling or numbness, No Cough - SOB.   No problems updated.  ALLERGIES: No Known Allergies  PAST MEDICAL HISTORY: Past Medical History:  Diagnosis Date  . Thyroid disease     MEDICATIONS AT HOME: Prior to Admission medications   Medication Sig Start Date End Date Taking? Authorizing Provider  diclofenac sodium (VOLTAREN) 1 % GEL Apply 2 g topically 4 (four) times daily. 03/19/16   Pete Glatter, MD  levothyroxine (SYNTHROID, LEVOTHROID) 150 MCG tablet Take 1 tablet (150 mcg total) by mouth daily. 07/12/15   Pete Glatter, MD  loratadine (CLARITIN) 10 MG tablet Take 1 tablet (10 mg total) by mouth daily. Patient not taking: Reported on 07/11/2015 05/28/12   Ripudeep Jenna Luo, MD  pantoprazole (PROTONIX) 40 MG tablet Take 1 tablet (40 mg total) by mouth daily. 03/19/16   Pete Glatter, MD     Objective:   Vitals:   03/19/16 0857    BP: 131/78  Pulse: 70  Resp: 16  Temp: 98.5 F (36.9 C)  TempSrc: Oral  SpO2: 98%  Weight: 201 lb 3.2 oz (91.3 kg)    Exam General appearance : Awake, alert, not in any distress. Speech Clear. Not toxic looking, pleasant. HEENT: Atraumatic and Normocephalic, pupils equally reactive to light. bilat tms clear. Neck: supple, no JVD. No cervical lymphadenopathy.   No goiter. Chest:Good air entry bilaterally, no added sounds.  Mild ttp on deep palpation of  mid sternum, reproducible pain.   CVS: S1 S2 regular, no murmurs/gallups or rubs. Abdomen: Bowel sounds active, Non tender and not distended with no gaurding, rigidity or rebound.  No bilat cva tenderness. Neg Murphy sign.  No specific area on spine produced pain, but one area on left lower rib on deep palpation induced pain. (per pt, got hurt w/ workers comp years ago, no fractures at time - this pain is similar to it.) Extremities: B/L Lower Ext shows no edema, both legs are warm to touch Neurology: Awake alert, and oriented X 3, CN II-XII grossly intact, Non focal Skin:No Rash  Data Review Lab Results  Component Value Date   HGBA1C 5.2 07/11/2015    Depression screen [facility] 2/9 07/11/2015 08/05/2013  Decreased Interest 0 0  Down, Depressed, Hopeless 0 0  PHQ - 2 Score 0 0      Assessment & Plan   1.  HYPOTHYROIDISM, POST-RADIOACTIVE IODINE Will rechk levels prior to renewing meds. - TSH - T4, Free  2. Dysuria - Urinalysis Dipstick - neg - Urine cytology ancillary only   - std wkup. - CBC with Differential - CMP and Liver - PSA  3. Diffuse pains, back/chest/abd; may be combo of msk pain from heavy lifting w/ Gastroesophageal reflux disease without esophagitis - rx waist belt for work when lifting/pushing heavy objects - back stretching exercises provided - trial voltaren gel prn - trial ppi 40 qd - gerd diet info provided, less Monster drinks recd due to nature of caffeine which can worsen gerd, and cause other  issues such as palpitations, etc.  4. Abdominal pain, unspecified abdominal location See #3  5. Screening cholesterol level - Lipid Panel  6. Bilateral low back pain without sciatica, unspecified chronicity See #3 - DG Thoracic Spine 4V; Future - DG Lumbar Spine Complete; Future  7. Encounter for screening for HIV - HIV antibody (with reflex)  8. Interpreter.  Interpreter was used to communicate directly with patient for the entire encounter including providing detailed patient instructions.   9. Financial aid   Patient have been counseled extensively about nutrition and exercise  Return in about 6 months (around 09/19/2016), or if symptoms worsen or fail to improve.  The patient was given clear instructions to go to ER or return to medical center if symptoms don't improve, worsen or new problems develop. The patient verbalized understanding. The patient was told to call to get lab results if they haven't heard anything in the next week.   This note has been created with Education officer, environmentalDragon speech recognition software and smart phrase technology. Any transcriptional errors are unintentional.   Pete Glatterawn T Kamaal Cast, MD, MBA/MHA [facility] [place], [place] [phone]   03/19/2016, 9:27 AM

## 2016-03-20 ENCOUNTER — Other Ambulatory Visit: Payer: Self-pay | Admitting: Internal Medicine

## 2016-03-20 LAB — URINE CYTOLOGY ANCILLARY ONLY
CHLAMYDIA, DNA PROBE: NEGATIVE
NEISSERIA GONORRHEA: NEGATIVE
TRICH (WINDOWPATH): NEGATIVE

## 2016-03-20 LAB — PSA: PSA: 0.3 ng/mL (ref <=4.0)

## 2016-03-20 MED ORDER — LEVOTHYROXINE SODIUM 150 MCG PO TABS: 150.0000 ug | ORAL_TABLET | Freq: Every day | ORAL | 3 refills | Status: DC

## 2016-03-25 ENCOUNTER — Ambulatory Visit: Payer: Self-pay | Admitting: Internal Medicine

## 2016-03-25 LAB — URINE CYTOLOGY ANCILLARY ONLY
Bacterial vaginitis: NEGATIVE
Candida vaginitis: NEGATIVE

## 2016-03-29 ENCOUNTER — Telehealth: Payer: Self-pay

## 2016-03-29 NOTE — Telephone Encounter (Signed)
-----   Message from Pete Glatterawn T Langeland, MD sent at 03/23/2016  9:15 AM EST ----- No stds on urine studies. thanks

## 2016-03-29 NOTE — Telephone Encounter (Signed)
CMA call to go over lab results  Patient did not answer but CMA left a VM stating the reason of the call & to call back

## 2016-03-29 NOTE — Telephone Encounter (Signed)
Patient return CMA call  Patient verify DOB   Patient was aware and understood  

## 2016-04-01 ENCOUNTER — Ambulatory Visit (HOSPITAL_COMMUNITY)
Admission: RE | Admit: 2016-04-01 | Discharge: 2016-04-01 | Disposition: A | Discharge status: Home / Self Care | Payer: Self-pay | Source: Ambulatory Visit | Attending: Internal Medicine | Admitting: Internal Medicine

## 2016-04-01 ENCOUNTER — Other Ambulatory Visit: Payer: Self-pay | Admitting: Internal Medicine

## 2016-04-01 DIAGNOSIS — M545 Low back pain, unspecified: Secondary | ICD-10-CM

## 2016-04-02 ENCOUNTER — Telehealth: Payer: Self-pay

## 2016-04-02 NOTE — Telephone Encounter (Signed)
-----   Message from Pete Glatterawn T Langeland, MD sent at 04/01/2016  1:42 PM EDT ----- No acute findings noted on xray of back. Recd back stretching exercise, ice/ heat to back region when pain, and wearing a waist belt w/ heavy lifting.

## 2016-04-02 NOTE — Telephone Encounter (Signed)
CMA call patient to inform patient about x ray results  Patient verify DOB  Patient was aware and understood

## 2016-04-11 ENCOUNTER — Ambulatory Visit: Payer: Self-pay | Attending: Internal Medicine | Admitting: Physician Assistant

## 2016-04-11 VITALS — BP 144/82 | HR 62 | Temp 99.3°F | Resp 16 | Wt 197.0 lb

## 2016-04-11 DIAGNOSIS — E079 Disorder of thyroid, unspecified: Secondary | ICD-10-CM | POA: Insufficient documentation

## 2016-04-11 DIAGNOSIS — M545 Low back pain, unspecified: Secondary | ICD-10-CM

## 2016-04-11 DIAGNOSIS — N529 Male erectile dysfunction, unspecified: Secondary | ICD-10-CM | POA: Insufficient documentation

## 2016-04-11 DIAGNOSIS — R1013 Epigastric pain: Secondary | ICD-10-CM | POA: Insufficient documentation

## 2016-04-11 DIAGNOSIS — M62838 Other muscle spasm: Secondary | ICD-10-CM | POA: Insufficient documentation

## 2016-04-11 DIAGNOSIS — Z79899 Other long term (current) drug therapy: Secondary | ICD-10-CM | POA: Insufficient documentation

## 2016-04-11 MED ORDER — MELOXICAM 7.5 MG PO TABS: 7.5000 mg | ORAL_TABLET | Freq: Every day | ORAL | 0 refills | Status: DC

## 2016-04-11 MED ORDER — METHOCARBAMOL 500 MG PO TABS: 500.0000 mg | ORAL_TABLET | Freq: Four times a day (QID) | ORAL | 0 refills | Status: DC

## 2016-04-11 NOTE — Patient Instructions (Signed)
Make a morning lab appointment for next week

## 2016-04-11 NOTE — Progress Notes (Signed)
Daniel Fuller, is a 41 y.o. male  ZOX:[id number]  [id number]  DOB - 1975-12-28  Subjective:  Chief Complaint and HPI: Daniel Fuller is a 41 y.o. male here today for continued pain in his L side and epigastric area.  Pain in abdomen is sometimes worse or better after meals.  Pain in his L side is intermittent and sharp.  No N/V/D.  He also relates that the last 2 times he has had sex, that his erection wasn't as firm as usual.  He is very concerned.  He has also been somewhat constipated and feels a heaviness in his perineum. He has not noticed any bulges or physical abnormalities. He has been having the symptoms for about 3-4 weeks.  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  No f/c  Last visit/labs and xrays reviewed.  TSH was stable, HIV, PSA, CMP, CBC, and other STI negative.    ROS:   Constitutional:  No f/c, No night sweats, No unexplained weight loss. EENT:  No vision changes, No blurry vision, No hearing changes. No mouth, throat, or ear problems.  Respiratory: No cough, No SOB Cardiac: No CP, no palpitations GI:  + abd pain, No N/V/D. GU: No Urinary s/sx Musculoskeletal: No joint pain Neuro: No headache, no dizziness, no motor weakness.  Skin: No rash Endocrine:  No polydipsia. No polyuria.  Psych: Denies SI/HI  No problems updated.  ALLERGIES: No Known Allergies  PAST MEDICAL HISTORY: Past Medical History:  Diagnosis Date  . Thyroid disease     MEDICATIONS AT HOME: Prior to Admission medications   Medication Sig Start Date End Date Taking? Authorizing Provider  diclofenac sodium (VOLTAREN) 1 % GEL Apply 2 g topically 4 (four) times daily. 03/19/16   Pete Glatter, MD  levothyroxine (SYNTHROID, LEVOTHROID) 150 MCG tablet Take 1 tablet (150 mcg total) by mouth daily. 03/20/16   Pete Glatter, MD  loratadine (CLARITIN) 10 MG tablet Take 1 tablet (10 mg total) by mouth daily. Patient not taking: Reported on 07/11/2015  05/28/12   Ripudeep Jenna Luo, MD  meloxicam (MOBIC) 7.5 MG tablet Take 1 tablet (7.5 mg total) by mouth daily. 04/11/16   Anders Simmonds, PA-C  methocarbamol (ROBAXIN) 500 MG tablet Take 1 tablet (500 mg total) by mouth 4 (four) times daily. Prn muscle spasm 04/11/16   Anders Simmonds, PA-C  pantoprazole (PROTONIX) 40 MG tablet Take 1 tablet (40 mg total) by mouth daily. 03/19/16   Pete Glatter, MD     Objective:  EXAM:   Vitals:   04/11/16 1617  BP: (!) 144/82  Pulse: 62  Resp: 16  Temp: 99.3 F (37.4 C)  TempSrc: Oral  SpO2: 98%  Weight: 197 lb (89.4 kg)    General appearance : A&OX3. NAD. Non-toxic-appearing HEENT: Atraumatic and Normocephalic.  PERRLA. EOM intact.  TM clear B. Mouth-MMM, post pharynx WNL w/o erythema, No PND. Neck: supple, no JVD. No cervical lymphadenopathy. No thyromegaly Chest/Lungs:  Breathing-non-labored, Good air entry bilaterally, breath sounds normal without rales, rhonchi, or wheezing  CVS: S1 S2 regular, no murmurs, gallops, rubs  Abdomen: Bowel sounds present, Non tender and not distended with no gaurding, rigidity or rebound.  No CVATTP Reflexes =B.  Neg SLR B.   Extremities: Bilateral Lower Ext shows no edema, both legs are warm to touch with = pulse throughout Neurology:  CN II-XII grossly intact, Non focal.   Psych:  TP linear. J/I WNL. Normal speech. Appropriate  eye contact and affect.  Skin:  No Rash  Data Review Lab Results  Component Value Date   HGBA1C 5.2 07/11/2015     Assessment & Plan   1. Acute low back pain without sciatica, unspecified back pain laterality Likely musculoskeletal.  L-S and thoracic spine films were negative - meloxicam (MOBIC) 7.5 MG tablet; Take 1 tablet (7.5 mg total) by mouth daily.  Dispense: 30 tablet; Refill: 0  2. Muscle spasm Hopefully this will help with pain - methocarbamol (ROBAXIN) 500 MG tablet; Take 1 tablet (500 mg total) by mouth 4 (four) times daily. Prn muscle spasm  Dispense: 90 tablet;  Refill: 0  3. Erectile dysfunction, unspecified erectile dysfunction type - Testosterone; Future  4. Epigastric pain - H. pylori Breath Collection; Future  Cause of his symptoms is unclear with all of his studies coming back essentially normal at this time.  Consider GI or Urology referral if not improving.    Patient have been counseled extensively about nutrition and exercise  Return in about 3 weeks (around 05/02/2016) for Dr Julien NordmannLangeland; f/up body aches, etc.  The patient was given clear instructions to go to ER or return to medical center if symptoms don't improve, worsen or new problems develop. The patient verbalized understanding. The patient was told to call to get lab results if they haven't heard anything in the next week.     Georgian CoAngela McClung, PA-C [facility] [place], [place] [phone]   04/11/2016, 4:38 PMPatient ID: Daniel Fuller, male   DOB: Jun 26, 1975, 41 y.o.   MRN: [id number]

## 2016-04-15 ENCOUNTER — Ambulatory Visit: Payer: Self-pay | Attending: Internal Medicine

## 2016-04-15 DIAGNOSIS — N529 Male erectile dysfunction, unspecified: Secondary | ICD-10-CM | POA: Insufficient documentation

## 2016-04-15 DIAGNOSIS — R1013 Epigastric pain: Secondary | ICD-10-CM | POA: Insufficient documentation

## 2016-04-16 LAB — TESTOSTERONE: TESTOSTERONE: 488 ng/dL (ref 264–916)

## 2016-04-16 NOTE — Progress Notes (Signed)
Patient here for lab visit only 

## 2016-04-17 LAB — H. PYLORI BREATH TEST

## 2016-04-17 LAB — H.PYLORI BREATH TEST (REFLEX): H. pylori Breath Test: POSITIVE — AB

## 2016-05-06 ENCOUNTER — Ambulatory Visit: Payer: Self-pay | Attending: Internal Medicine | Admitting: Internal Medicine

## 2016-05-06 ENCOUNTER — Encounter: Payer: Self-pay | Admitting: Internal Medicine

## 2016-05-06 ENCOUNTER — Ambulatory Visit: Payer: Self-pay | Admitting: Internal Medicine

## 2016-05-06 VITALS — BP 129/78 | HR 62 | Temp 98.1°F | Resp 18 | Ht 66.0 in | Wt 196.0 lb

## 2016-05-06 DIAGNOSIS — R1010 Upper abdominal pain, unspecified: Secondary | ICD-10-CM | POA: Insufficient documentation

## 2016-05-06 DIAGNOSIS — Z79899 Other long term (current) drug therapy: Secondary | ICD-10-CM | POA: Insufficient documentation

## 2016-05-06 DIAGNOSIS — B9681 Helicobacter pylori [H. pylori] as the cause of diseases classified elsewhere: Secondary | ICD-10-CM | POA: Insufficient documentation

## 2016-05-06 DIAGNOSIS — M549 Dorsalgia, unspecified: Secondary | ICD-10-CM | POA: Insufficient documentation

## 2016-05-06 DIAGNOSIS — E018 Other iodine-deficiency related thyroid disorders and allied conditions: Secondary | ICD-10-CM

## 2016-05-06 DIAGNOSIS — M62838 Other muscle spasm: Secondary | ICD-10-CM | POA: Insufficient documentation

## 2016-05-06 DIAGNOSIS — E890 Postprocedural hypothyroidism: Secondary | ICD-10-CM | POA: Insufficient documentation

## 2016-05-06 DIAGNOSIS — A048 Other specified bacterial intestinal infections: Secondary | ICD-10-CM

## 2016-05-06 MED ORDER — AMOXICILLIN 500 MG PO CAPS: 1000.0000 mg | ORAL_CAPSULE | Freq: Two times a day (BID) | ORAL | 0 refills | Status: DC

## 2016-05-06 MED ORDER — DICLOFENAC SODIUM 1 % TD GEL: 2.0000 g | Freq: Four times a day (QID) | TRANSDERMAL | 2 refills | Status: DC

## 2016-05-06 MED ORDER — CLARITHROMYCIN 500 MG PO TABS: 500.0000 mg | ORAL_TABLET | Freq: Two times a day (BID) | ORAL | 0 refills | Status: DC

## 2016-05-06 MED ORDER — METHOCARBAMOL 500 MG PO TABS: 500.0000 mg | ORAL_TABLET | Freq: Three times a day (TID) | ORAL | 0 refills | Status: DC | PRN

## 2016-05-06 MED ORDER — PANTOPRAZOLE SODIUM 40 MG PO TBEC: 40.0000 mg | DELAYED_RELEASE_TABLET | Freq: Two times a day (BID) | ORAL | 0 refills | Status: DC

## 2016-05-06 NOTE — Progress Notes (Signed)
Patient is here for FU  Patient complains of head and neck pain being present. Patient had some material fall on his head at work at the end of January 2018. Patient has experienced headaches, dizziness and neck pain since.  Patient has taken medication today. Patient has eaten today.

## 2016-05-06 NOTE — Patient Instructions (Addendum)
Avoid NSAIDS/ aspirin, ibuprofen, naproxen  -tylenol ok    Infeccin por Helicobacter Pylori (Helicobacter Pylori Infection) La infeccin por Helicobacter pylori es una infeccin en el estmago que es causada por la bacteria Helicobacter pylori (H. pylori). Este tipo de bacteria vive frecuentemente en el revestimiento del estmago. La infeccin puede causar lceras e irritacin (gastritis) en algunas personas. Es la causa ms comn de lceras en el estmago (lcera gstrica) y en la parte superior del intestino (lcera duodenal). Tener esta infeccin tambin puede aumentar el riesgo de cncer de Teaching laboratory technician y un tipo de cncer de los glbulos blancos (linfoma) que afecta al [place]. CAUSAS H. pylori es un tipo de bacteria que se encuentra frecuentemente en el estmago de las personas saludables. La bacteria puede pasar de Neomia Dear persona a otra por contacto a travs de las heces o la saliva. No se sabe por qu algunas personas desarrollan lceras, gastritis o cncer a partir de la infeccin. FACTORES DE RIESGO Es ms probable que esta afeccin se manifieste en las personas que:  Tienen familiares con esta infeccin.  Viven con [employer]; por ejemplo, en un dormitorio.  Son de origen africano, hispano o asitico. SNTOMAS La mayora de las personas con esta infeccin no tienen sntomas. Si tiene sntomas, estos pueden incluir los siguientes:  Merchant navy officer.  Dolor de [place].  Nuseas.  Vmitos.  Sangre en el vmito.  Prdida del apetito.  Mal aliento. DIAGNSTICO Esta afeccin se puede diagnosticar en funcin de los sntomas, un examen fsico y Futures trader. Podrn solicitarle otros estudios, por ejemplo:  Anlisis de sangre o pruebas de materia fecal para verificar las protenas (anticuerpos) que el cuerpo puede producir en respuesta a las bacterias. Estas pruebas son la mejor manera de confirmar el diagnstico.  Neomia Dear prueba de aliento para verificar el tipo de  gas que la bacteria H. pylori libera despus de descomponer una sustancia llamada urea. Para la prueba, se le pide que beba urea. Esta prueba se realiza frecuentemente despus del tratamiento para saber si el tratamiento funcion.  Un procedimiento en el que un tubo delgado y flexible con una pequea cmara en el extremo se coloca en el estmago y el intestino superior (endoscopia superior). El mdico tambin puede tomar muestras de tejido (biopsia) para Education officer, environmental pruebas de H. pylori y cncer. TRATAMIENTO El tratamiento para esta afeccin generalmente implica tomar una combinacin de medicamentos (terapia triple) durante un par de semanas. La terapia triple incluye un medicamento para reducir el cido en el estmago y dos tipos de antibiticos. Se aprobaron muchas combinaciones de frmacos para el tratamiento. El tratamiento generalmente mata a la H. pylori y reduce el riesgo de cncer. Despus del tratamiento, es posible que deba volver a realizarse una prueba de H. pylori. En algunos casos, es posible que sea necesario repetir Scientist, research (medical). INSTRUCCIONES PARA EL CUIDADO EN EL HOGAR  Tome los medicamentos de venta libre y los recetados solamente como se lo haya indicado el mdico.  Tome los antibiticos como se lo haya indicado el mdico. No deje de tomar los antibiticos aunque comience a sentirse mejor.  Puede retomar todas sus actividades habituales y Educational psychologist su dieta habitual.  Tome estas medidas para evitar infecciones futuras:  Lvese las manos con frecuencia.  Asegrese de que los alimentos que consume se hayan preparado adecuadamente.  Beba agua solamente de fuentes limpias.  Concurra a todas las visitas de control como se lo haya indicado el mdico. Esto es importante. SOLICITE ATENCIN MDICA SI:  Los  sntomas no mejoran.  Los sntomas regresan despus del tratamiento. Esta informacin no tiene Theme park manager el consejo del mdico. Asegrese de hacerle al mdico  cualquier pregunta que tenga. Document Released: 04/24/2015 Document Revised: 04/24/2015 Document Reviewed: 01/12/2014 Elsevier Interactive Patient Education  2017 Elsevier Inc.  \-   Dolor de espalda en adultos (Back Pain, Adult) El dolor de espalda es muy frecuente. A menudo mejora con el tiempo. La causa del dolor de espalda generalmente no es peligrosa. La Harley[employer]son de las personas puede aprender a Runner, broadcasting/film/video de espalda por s mismas. CUIDADOS EN EL HOGAR Controle su dolor de espalda a fin de Public house manager cambio. Las siguientes indicaciones ayudarn a Psychologist, clinical que pueda sentir:  Materials engineer. Comience con caminatas cortas sobre superficies planas si es posible. Trate de caminar un poco ms cada da.  Haga ejercicios con regularidad tal como le indic el mdico. El ejercicio ayuda a que su espalda se cure ms rpidamente. Tambin ayuda a prevenir futuras lesiones al [employer] fuertes y flexibles.  No se siente, conduzca ni permanezca de pie durante ms de 30 minutos.  No permanezca en la cama. Si hace reposo ms de 1 a 2 das, puede demorar su recuperacin.  Sea cuidadoso al inclinarse o levantar un objeto. Use una tcnica apropiada para levantar peso:  Flexione las rodillas.  Mantenga el objeto cerca del cuerpo.  No gire.  Duerma sobre un NVR Inc. Recustese sobre un costado y flexione las rodillas. Si se recuesta Fisher [employer], coloque una almohada debajo de las rodillas.  Tome los medicamentos solamente como se lo haya indicado el mdico.  Aplique hielo sobre la zona lesionada.  Ponga el hielo en una bolsa plstica.  Coloque una toalla entre la piel y la bolsa de hielo.  Deje el hielo durante , 2 a 3veces por da, durante los primeros 2 o 3das. Despus de eso, puede alternar entre compresas de hielo y Airline pilot.  Evite sentir ansiedad o estrs. Encuentre maneras efectivas de lidiar con el estrs, Surveyor, mining  ejercicio.  Mantenga un peso saludable. El peso excesivo ejerce tensin sobre la espalda. SOLICITE AYUDA SI:  Siente dolor que no se alivia con reposo o medicamentos.  Siente cada vez ms dolor que se extiende a las piernas o los glteos.  El dolor no mejora en una semana.  Siente dolor por la noche.  Pierde peso.  Siente escalofros o fiebre. SOLICITE AYUDA DE INMEDIATO SI:  No puede controlar su materia fecal (heces) o el pis (orina).  Siente debilidad en las piernas o los brazos.  Siente prdida de la sensibilidad (adormecimiento) en las piernas o los brazos.  Tiene malestar estomacal (nuseas) o vomita.  Siente dolor de estmago (abdominal).  Siente que se desvanece (se desmaya). Esta informacin no tiene Theme park manager el consejo del mdico. Asegrese de hacerle al mdico cualquier pregunta que tenga. Document Released: 07/16/2010 Document Revised: 01/21/2014 Document Reviewed: 05/04/2013 Elsevier Interactive Patient Education  2017 [employer].  -  RHCE para los cuidados de rutina de las lesiones (RICE for Routine Care of Injuries) Muchas lesiones pueden tratarse con reposo, hielo, compresin y elevacin (RHCE). Un plan RHCE puede ayudar a Engineer, materials y reducir la hinchazn, y, adems, a que el cuerpo se recupere. Reposo  Reduzca las actividades que realiza normalmente y evite usar la zona lesionada del cuerpo. Puede reanudar las actividades normales cuando se sienta bien y el mdico lo autorice. Hielo  No se  aplique hielo directamente sobre la piel.  Ponga el hielo en una bolsa plstica.  Coloque una toalla entre la piel y la bolsa de hielo.  Coloque el hielo durante 20 minutos, 2 a 3 veces por da. Hgalo durante el tiempo que el mdico se lo haya indicado. Compresin  La compresin implica ejercer presin sobre la zona lesionada y se puede Education officer, environmental con IT consultant. Si se coloc una venda elstica:  Qutese y vuelva a colocarse la venda  cada 3 o 4horas, o como se lo haya indicado el mdico.  Asegrese de que la venda no est muy ajustada. Afljela si una zona del cuerpo ms all de la venda se torna de color azul, est hinchada, se enfra o le causa dolor, o si pierde la sensibilidad en esa rea (adormecimiento).  Consulte al mdico si la venda parece [employer]. Elevacin  La elevacin implica mantener elevada la zona lesionada. Eleve la zona lesionada por encima del nivel del corazn o del centro del pecho si puede hacerlo. CUNDO PEDIR AYUDA? Debe solicitar ayuda si:  El dolor y la hinchazn continan.  Los sntomas empeoran. CUNDO DEBO OBTENER AYUDA DE INMEDIATO? Debe obtener ayuda de inmediato en los siguientes casos:  Siente un dolor intenso repentino en la zona de la lesin o por debajo de esta.  Tiene irritacin o ms hinchazn alrededor de la lesin.  Tiene hormigueo o adormecimiento en la zona de la lesin o por debajo de esta que no desaparecen despus de quitarse la venda. Esta informacin no tiene Theme park manager el consejo del mdico. Asegrese de hacerle al mdico cualquier pregunta que tenga. Document Released: 03/29/2008 Document Revised: 03/25/2011 Document Reviewed: 12/08/2013 Elsevier Interactive Patient Education  2017 [employer].  -  Dieta suave ([place] Diet) La dieta suave se compone de alimentos que no contienen mucha grasa o [place]. Para el cuerpo, es ms fcil digerir los alimentos con bajo contenido de [place] o de [place]. Adems, es menos probable que estos causen Sanmina[employer]I boca, la garganta, el estmago y otras partes del tubo digestivo. A menudo, se conoce a la dieta suave como dieta BRAT (por sus siglas en ingls). EN QU CONSISTE EL PLAN? El mdico o el nutricionista pueden recomendar cambios especficos en la dieta para evitar y tratar los sntomas, por ejemplo:  Consumir pequeas cantidades de comida con frecuencia.  Cocinar los alimentos hasta que estn  lo bastante blandos para masticarlos con facilidad.  Masticar bien la comida.  Beber lquidos lentamente.  No consumir alimentos muy picantes, cidos o grasosos.  No comer frutas ctricas, como naranjas y pomelos. QU DEBO SABER ACERCA DE ESTA DIETA?  Consuma diferentes alimentos de lista de alimentos de la dieta [place].  No siga una dieta suave durante ms tiempo del [place].  Pregntele al mdico si debe tomar vitaminas. QU ALIMENTOS PUEDO COMER? Cereales Cereales calientes, como crema de trigo. Panes, galletas o tortillas elaborados con harina blanca refinada. Arroz. Verduras Verduras cocidas o enlatadas. Pur de papas o papas hervidas. Nils Pyle Bananas. Pur de [employer]. Otros tipos de frutas cocidas o enlatadas peladas y sin semillas, por ejemplo, duraznos o peras en lata. Carnes y otras fuentes de protenas Huevos revueltos. Mantequilla de man cremosa u otras mantequillas de frutos secos. Carnes Corning Incorporated cocidas, como ave o pescado. Tofu. Sopas o caldos. Lcteos Productos lcteos sin grasa, como [place], queso cottage y Dentist. CHS Inc. T de hierbas. Jugo de [place]. Dulces y postres Pudin. Natillas. Gelatina de frutas. Helados.  Grasas y aceites Aderezos suaves para ensaladas. Aceite de canola o de oliva. Esta no es Raytheon de los alimentos o las bebidas permitidos. Comunquese con el nutricionista para conocer ms opciones. QU ALIMENTOS NO SE RECOMIENDAN? Los alimentos y los ingredientes que frecuentemente no se recomiendan incluyen los siguientes:  Alimentos picantes, como salsas picantes.  Comidas fritas.  Alimentos cidos, como encurtidos o alimentos fermentados.  Verduras o frutas crudas, especialmente ctricos o frutos del bosque.  Bebidas que contengan cafena.  Alcohol.  Aderezos o condimentos muy saborizados. Es posible que los productos que se enumeran ms arriba no sean una lista completa de los alimentos y las bebidas que no estn  permitidos. Comunquese con el nutricionista para obtener ms informacin. Esta informacin no tiene Theme park manager el consejo del mdico. Asegrese de hacerle al mdico cualquier pregunta que tenga. Document Released: 04/24/2015 Document Revised: 04/24/2015 Document Reviewed: 01/12/2014 Elsevier Interactive Patient Education  2017 [employer].

## 2016-05-06 NOTE — Progress Notes (Signed)
Daniel Fuller, is a 41 y.o. male  ZOX:[id number]  [id number]:[id number]  DOB - 07/05/1975  Chief Complaint  Patient presents with  . Generalized Body Aches        Subjective:   Daniel Fuller is a 41 y.o. male here today for a follow up visit, last seen 04/11/16 for abd pains/back pains, still unchanged. Of note, pains sometimes goes up to his upper back as well. Says he gets some tail bone numbness, but denies urinary or stool incontinence.  He states he has been taking mobic, but gets stomach irritation afterwards. Denies any brbpr/hematemesis/melena.   Patient has No headache, No chest pain, No abdominal pain - No Nausea, No new weakness tingling or numbness, No Cough - SOB.  Pt speaks/understands English, but we had Interpreter to communicate directly with patient for the entire encounter including providing detailed patient instructions.   No problems updated.  ALLERGIES: No Known Allergies  PAST MEDICAL HISTORY: Past Medical History:  Diagnosis Date  . Thyroid disease     MEDICATIONS AT HOME: Prior to Admission medications   Medication Sig Start Date End Date Taking? Authorizing Provider  levothyroxine (SYNTHROID, LEVOTHROID) 150 MCG tablet Take 1 tablet (150 mcg total) by mouth daily. 03/20/16  Yes Pete Glatter, MD  methocarbamol (ROBAXIN) 500 MG tablet Take 1 tablet (500 mg total) by mouth every 8 (eight) hours as needed for muscle spasms. Prn muscle spasm 05/06/16  Yes Pete Glatter, MD  pantoprazole (PROTONIX) 40 MG tablet Take 1 tablet (40 mg total) by mouth 2 (two) times daily. 05/06/16  Yes Pete Glatter, MD  amoxicillin (AMOXIL) 500 MG capsule Take 2 capsules (1,000 mg total) by mouth 2 (two) times daily. 05/06/16   Pete Glatter, MD  clarithromycin (BIAXIN) 500 MG tablet Take 1 tablet (500 mg total) by mouth 2 (two) times daily. 05/06/16   Pete Glatter, MD  diclofenac sodium (VOLTAREN) 1 % GEL Apply 2 g topically 4 (four) times  daily. 05/06/16   Pete Glatter, MD  loratadine (CLARITIN) 10 MG tablet Take 1 tablet (10 mg total) by mouth daily. Patient not taking: Reported on 07/11/2015 05/28/12   Ripudeep Jenna Luo, MD     Objective:   Vitals:   05/06/16 1627  BP: 129/78  Pulse: 62  Resp: 18  Temp: 98.1 F (36.7 C)  TempSrc: Oral  SpO2: 99%  Weight: 196 lb (88.9 kg)  Height:  (1.676 m)    Exam General appearance : Awake, alert, not in any distress. Speech Clear. Not toxic looking HEENT: Atraumatic and Normocephalic, pupils equally reactive to light. Neck: supple, no JVD.  Chest:Good air entry bilaterally, no added sounds. CVS: S1 S2 regular, no murmurs/gallups or rubs. Abdomen: Bowel sounds active, Non tender and not distended with no gaurding, rigidity or rebound. No g/r Extremities: B/L Lower Ext shows no edema, both legs are warm to touch Neurology: Awake alert, and oriented X 3, CN II-XII grossly intact, Non focal Skin:No Rash  Data Review Lab Results  Component Value Date   HGBA1C 5.2 07/11/2015    Depression screen [facility] 2/9 05/06/2016 04/11/2016 03/19/2016 07/11/2015 08/05/2013  Decreased Interest 0 0  Down, Depressed, Hopeless 0 0  PHQ - 2 Score 0 0  Altered sleeping 1 1 0 - -  Tired, decreased energy - -  Change in appetite 0 1 0 - -  Feeling bad or failure about yourself  0 0 0 - -  Trouble concentrating 1 1 0 - -  Moving slowly or fidgety/restless 0 1 0 - -  Suicidal thoughts 0 0 0 - -  PHQ-9 Score - -      Assessment & Plan   1. H. pylori infection - suspect w/ ulcer, given c/o of bilat upper abd pains and back pains - treat w/ ppi bid, amoxil 1000bid, and clarithromycin 500bid. - come back for rechk of clearance.  2. HYPOTHYROIDISM, POST-RADIOACTIVE IODINE On synthroid  3. Muscle spasm - cautioned to stop mobic, and avoid all nsaids for now due above infection and concerns for ulcers - tylenol ok - methocarbamol (ROBAXIN) 500 MG tablet;  Take 1 tablet (500 mg total) by mouth every 8 (eight) hours as needed for muscle spasms. Prn muscle spasm  Dispense: 50 tablet; Refill: 0 - also gave pt rx volaterin gel, which is a safer alternative to po nsaid.    Patient have been counseled extensively about nutrition and exercise  Return in about 3 weeks (around 05/27/2016) for rechk hpylori /ulcer.  The patient was given clear instructions to go to ER or return to medical center if symptoms don't improve, worsen or new problems develop. The patient verbalized understanding. The patient was told to call to get lab results if they haven't heard anything in the next week.   This note has been created with Education officer, environmental. Any transcriptional errors are unintentional.   Pete Glatter, MD, MBA/MHA [facility], [place] [phone]   05/06/2016, 5:18 PM

## 2016-05-27 ENCOUNTER — Ambulatory Visit: Payer: Self-pay | Attending: Internal Medicine | Admitting: Internal Medicine

## 2016-05-27 ENCOUNTER — Encounter: Payer: Self-pay | Admitting: Internal Medicine

## 2016-05-27 VITALS — BP 120/75 | HR 81 | Temp 98.4°F | Resp 16 | Wt 191.2 lb

## 2016-05-27 DIAGNOSIS — A048 Other specified bacterial intestinal infections: Secondary | ICD-10-CM

## 2016-05-27 DIAGNOSIS — Z79899 Other long term (current) drug therapy: Secondary | ICD-10-CM | POA: Insufficient documentation

## 2016-05-27 DIAGNOSIS — K219 Gastro-esophageal reflux disease without esophagitis: Secondary | ICD-10-CM

## 2016-05-27 DIAGNOSIS — B9681 Helicobacter pylori [H. pylori] as the cause of diseases classified elsewhere: Secondary | ICD-10-CM | POA: Insufficient documentation

## 2016-05-27 DIAGNOSIS — B9689 Other specified bacterial agents as the cause of diseases classified elsewhere: Secondary | ICD-10-CM | POA: Insufficient documentation

## 2016-05-27 MED ORDER — SUCRALFATE 1 GM/10ML PO SUSP: 1.0000 g | Freq: Three times a day (TID) | ORAL | 0 refills | Status: DC

## 2016-05-27 MED ORDER — FAMOTIDINE 20 MG PO TABS: 20.0000 mg | ORAL_TABLET | Freq: Two times a day (BID) | ORAL | 2 refills | Status: DC

## 2016-05-27 MED FILL — CARAFATE 1 GM/10 ML SUSP: 1 | 12 days supply | Qty: 420 | Fill #0

## 2016-05-27 MED FILL — FAMOTIDINE 20 MG TABLET: 20 | 30 days supply | Qty: 60 | Fill #0

## 2016-05-27 NOTE — Progress Notes (Signed)
Daniel Fuller, is a 41 y.o. male  ZOX:[id number]  [id number]  DOB - 1975-07-27  Chief Complaint  Patient presents with  . Follow-up        Subjective:   Daniel Fuller is a 41 y.o. male here today for a follow up visit of gerd and hpylori, suspected ulcer. Last seen 05/06/16. Of note, [facility] but he filled it there. He finished the abx, and is still taking the protonix. Still w/ meg discomfort and bilat upper quad pains, but not as severe as prior.  Still eating  A lot of tomato/sauce, did not know it can cause gerd.  He has been avoiding spicy foods though.  Patient has No headache, No chest pain, No abdominal pain - No Nausea, No new weakness tingling or numbness, No Cough - SOB.  Interpreter was used to communicate directly with patient for the entire encounter including providing detailed patient instructions.   Problem  Bacterial Infection Due to H. Pylori    ALLERGIES: No Known Allergies  PAST MEDICAL HISTORY: Past Medical History:  Diagnosis Date  . Thyroid disease     MEDICATIONS AT HOME: Prior to Admission medications   Medication Sig Start Date End Date Taking? Authorizing Provider  amoxicillin (AMOXIL) 500 MG capsule Take 2 capsules (1,000 mg total) by mouth 2 (two) times daily. 05/06/16   Pete GlatterLangeland, Nakiya Rallis T, MD  clarithromycin (BIAXIN) 500 MG tablet Take 1 tablet (500 mg total) by mouth 2 (two) times daily. 05/06/16   Pete GlatterLangeland, Shayona Hibbitts T, MD  diclofenac sodium (VOLTAREN) 1 % GEL Apply 2 g topically 4 (four) times daily. 05/06/16   Pete GlatterLangeland, Zyanna Leisinger T, MD  famotidine (PEPCID) 20 MG tablet Take 1 tablet (20 mg total) by mouth 2 (two) times daily. 05/27/16   Pete GlatterLangeland, Jendaya Gossett T, MD  levothyroxine (SYNTHROID, LEVOTHROID) 150 MCG tablet Take 1 tablet (150 mcg total) by mouth daily. 03/20/16   Pete GlatterLangeland, Guenevere Roorda T, MD  loratadine (CLARITIN) 10 MG tablet Take 1 tablet (10 mg total) by mouth daily. Patient not taking:  Reported on 07/11/2015 05/28/12   Rai, Delene Ruffiniipudeep K, MD  methocarbamol (ROBAXIN) 500 MG tablet Take 1 tablet (500 mg total) by mouth every 8 (eight) hours as needed for muscle spasms. Prn muscle spasm 05/06/16   Dierdre SearlesLangeland, Rue Tinnel T, MD  sucralfate (CARAFATE) 1 GM/10ML suspension Take 10 mLs (1 g total) by mouth 4 (four) times daily -  with meals and at bedtime. 05/27/16   Pete GlatterLangeland, Tiara Maultsby T, MD     Objective:   Vitals:   05/27/16 1620  BP: 120/75  Pulse: 81  Resp: 16  Temp: 98.4 F (36.9 C)  TempSrc: Oral  SpO2: 97%  Weight: 191 lb 3.2 oz (86.7 kg)    Exam General appearance : Awake, alert, not in any distress. Speech Clear. Not toxic looking HEENT: Atraumatic and Normocephalic, pupils equally reactive to light. Neck: supple, no JVD. No cervical lymphadenopathy.  Chest:Good air entry bilaterally, no added sounds. CVS: S1 S2 regular, no murmurs/gallups or rubs. Abdomen: Bowel sounds active, Non tender and not distended with no gaurding, rigidity or rebound. Extremities: B/L Lower Ext shows no edema, both legs are warm to touch Neurology: Awake alert, and oriented X 3, CN II-XII grossly intact, Non focal Skin:No Rash  Data Review Lab Results  Component Value Date   HGBA1C 5.2 07/11/2015    [facility] 05/27/2016 05/06/2016 04/11/2016 03/19/2016 07/11/2015  Decreased Interest 0 1 2 1  0  Down, Depressed, Hopeless 1 1 1 1  0  PHQ - 2 Score 1 2 3 2  0  Altered sleeping 1 1 1  0 -  Tired, decreased energy 1 1 1 1  -  Change in appetite 1 0 1 0 -  Feeling bad or failure about yourself  1 0 0 0 -  Trouble concentrating 1 1 1  0 -  Moving slowly or fidgety/restless 0 0 1 0 -  Suicidal thoughts 0 0 0 0 -  PHQ-9 Score 6 5 8 3  -      Assessment & Plan   1. Bacterial infection due to H. pylori - currently still on ppi - dc ppi, and start pepcid 20bid, tums otc ok, - avoid peptobismul as well for now  - H. pylori breath test; Future - in 2wks repeat study when off ppi. May need  another course of abx.   2. Gastroesophageal reflux disease without esophagitis Given persistent meg pain, will rx carafate suspension qid for now pepcid bid tums ok gerd diet again discussed and info provided, avoid spicy foods, caffeine, chocolate, cigarette smoke, tomato sauce, etc.  Pt does not drink etoh, former smoker.     Patient have been counseled extensively about nutrition and exercise  Return in about 4 weeks (around 06/24/2016) for gerd / h pylori Bettey Costa.  The patient was given clear instructions to go to ER or return to medical center if symptoms don't improve, worsen or new problems develop. The patient verbalized understanding. The patient was told to call to get lab results if they haven't heard anything in the next week.   This note has been created with Education officer, environmental. Any transcriptional errors are unintentional.   Pete Glatter, MD, MBA/MHA [facility] and [facility] [place], [place] [phone]   05/27/2016, 4:43 PM

## 2016-05-27 NOTE — Patient Instructions (Addendum)
2 weeks lab followup for urea breath test - do not take protonix  or pepto bismul  - tums ok for acid reflux.  Opciones de alimentos para pacientes con reflujo gastroesofgico - Adultos (Food Choices for Gastroesophageal Reflux Disease, Adult) Cuando se tiene reflujo gastroesofgico (ERGE), los alimentos que se ingieren y los hbitos de alimentacin son muy importantes. Elegir los alimentos adecuados puede ayudar a Altria Groupaliviar las molestias. QU PAUTAS DEBO SEGUIR?  Elija las frutas, los vegetales, los cereales integrales y los productos lcteos con bajo contenido de [place]rasa.  Avoid Tomato sauce, ketchup  Elija las carnes de vaca, de pescado y de ave con bajo contenido de grasas.  Limite las grasas, [address]como los Peach Lakeaceites, los aderezos para [place]ada, la Juneaumanteca, los frutos secos y Programme researcher, broadcasting/film/videoel aguacate.  Lleve un registro de alimentos. Esto ayuda a identificar los alimentos que ocasionan sntomas.  Evite los alimentos que le ocasionen sntomas. Pueden ser distintos para cada persona.  Haga comidas pequeas durante Glass blower/designerel da en lugar de 3 comidas abundantes.  Coma lentamente, en un lugar donde est distendido.  Limite el consumo de alimentos fritos.  Cocine los alimentos utilizando mtodos que no sean la fritura.  Evite el consumo alcohol.  Evite beber grandes cantidades de lquidos con las comidas.  Evite agacharse o recostarse hasta despus de 2 o 3horas de haber comido. QU ALIMENTOS NO SE RECOMIENDAN? Estos son algunos alimentos y bebidas que pueden empeorar los sntomas: Veterinary surgeonVegetales Tomates. Jugo de tomate. Salsa de tomate y espagueti. Ajes. Cebolla y [place]ajo. Rbano picante. Frutas Naranjas, pomelos y limn (fruta y Sloveniajugo). Carnes Carnes de [place], de pescado y de ave con gran contenido de grasas. Esto incluye los perros calientes, las Snydertowncostillas, el Nickersonjamn, la salchicha, el salame y el tocino. Lcteos Leche entera y [place]leche chocolatada. PPG IndustriesCrema cida. Crema. Mantequilla. Helados. Queso crema. Bebidas T  o caf. Bebidas gaseosas o bebidas energizantes. Condimentos Salsa picante. Salsa barbacoa. Dulces/postres Chocolate y cacao. Rosquillas. Menta y mentol. Grasas y Du Pontaceites Alimentos muy grasos. Esto incluye las papas fritas. Otros Vinagre. Especias picantes. Esto incluye la pimienta negra, la pimienta blanca, la pimienta roja, la pimienta de cayena, el curry en polvo, los clavos de [place]olor, el jengibre y el [place] en polvo. Esta no es Raytheonuna lista completa de los alimentos y las bebidas que se Theatre stage managerdeben evitar. Comunquese con el nutricionista para recibir ms informacin. Esta informacin no tiene Theme park managercomo fin reemplazar el consejo del mdico. Asegrese de hacerle al mdico cualquier pregunta que tenga. Document Released: 07/02/2011 Document Revised: 01/21/2014 Document Reviewed: 11/04/2012 Elsevier Interactive Patient Education  2017 [employer]vier Inc.  -

## 2016-05-30 ENCOUNTER — Encounter: Payer: Self-pay | Admitting: Internal Medicine

## 2016-05-31 ENCOUNTER — Encounter: Payer: Self-pay | Admitting: Internal Medicine

## 2016-06-03 ENCOUNTER — Encounter: Payer: Self-pay | Admitting: Internal Medicine

## 2016-06-11 ENCOUNTER — Ambulatory Visit: Payer: Self-pay | Attending: Internal Medicine

## 2016-06-11 DIAGNOSIS — A048 Other specified bacterial intestinal infections: Secondary | ICD-10-CM | POA: Insufficient documentation

## 2016-06-11 NOTE — Progress Notes (Signed)
Patient here for lab visit only 

## 2016-06-12 LAB — H. PYLORI BREATH TEST: H pylori Breath Test: NEGATIVE

## 2016-06-25 ENCOUNTER — Encounter: Payer: Self-pay | Admitting: Internal Medicine

## 2016-06-25 ENCOUNTER — Ambulatory Visit: Payer: Self-pay | Attending: Internal Medicine | Admitting: Internal Medicine

## 2016-06-25 VITALS — BP 126/78 | HR 80 | Temp 98.9°F | Resp 16 | Wt 190.0 lb

## 2016-06-25 DIAGNOSIS — Z87891 Personal history of nicotine dependence: Secondary | ICD-10-CM | POA: Insufficient documentation

## 2016-06-25 DIAGNOSIS — E018 Other iodine-deficiency related thyroid disorders and allied conditions: Secondary | ICD-10-CM

## 2016-06-25 DIAGNOSIS — K219 Gastro-esophageal reflux disease without esophagitis: Secondary | ICD-10-CM | POA: Insufficient documentation

## 2016-06-25 DIAGNOSIS — E039 Hypothyroidism, unspecified: Secondary | ICD-10-CM | POA: Insufficient documentation

## 2016-06-25 MED ORDER — LEVOTHYROXINE SODIUM 150 MCG PO TABS: 150.0000 ug | ORAL_TABLET | Freq: Every day | ORAL | 3 refills | Status: DC

## 2016-06-25 MED FILL — FAMOTIDINE 20 MG TABLET: 20 | 30 days supply | Qty: 60 | Fill #1

## 2016-06-25 NOTE — Progress Notes (Signed)
    Patient ID: Daniel Fuller, male    DOB: 1975/04/24  MRN: [id number]  CC: Establish Care and Follow-up   Subjective: Daniel Fuller is a 41 y.o. male who presents for follow-up of GERD and H. pylori positive His concerns today include:   Patient completed triple therapy for H. pylori positive gastritis. Repeat testing was negative posttreatment. He is feeling better with just occasional pains in the left upper quadrant and epigastric area. He has tried to avoid foods that can cause a flareup of GERD including spicy foods and foods with tomatoe base. -Reports a feeling of spasm in the left lower quadrant of the abdomen just today. No pain. He is moving his bowels normally.  He requests refill on levothyroxine.  Patient Active Problem List   Diagnosis Date Noted  . Bacterial infection due to H. pylori 05/27/2016  . Epigastric pain 04/15/2016  . HYPOTHYROIDISM, POST-RADIOACTIVE IODINE 01/05/2007     Current Outpatient Prescriptions on File Prior to Visit  Medication Sig Dispense Refill  . famotidine (PEPCID) 20 MG tablet Take 1 tablet (20 mg total) by mouth 2 (two) times daily. 60 tablet 2  . diclofenac sodium (VOLTAREN) 1 % GEL Apply 2 g topically 4 (four) times daily. (Patient not taking: Reported on 06/25/2016) 100 g 2   No current facility-administered medications on file prior to visit.     No Known Allergies  Social History   Social History  . Marital status: Married    Spouse name: N/A  . Number of children: N/A  . Years of education: N/A   Occupational History  . Not on file.   Social History Main Topics  . Smoking status: Former Smoker    Packs/day: 0.40    Years: 6.00    Types: Cigarettes    Start date: 08/06/1994    Quit date: 08/05/2000  . Smokeless tobacco: Current User  . Alcohol use Yes  . Drug use: No  . Sexual activity: Yes   Other Topics Concern  . Not on file   Social History Narrative  . No narrative on file    No  family history on file.  No past surgical history on file.  ROS: Review of Systems as stated above   PHYSICAL EXAM: BP 126/78   Pulse 80   Temp 98.9 F (37.2 C) (Oral)   Resp 16   Wt 190 lb (86.2 kg)   SpO2 98%   BMI 30.67 kg/m   Physical Exam  General appearance - alert, well appearing, and in no distress Mental status - alert, oriented to person, place, and time, normal mood, behavior, speech, dress, motor activity, and thought processes Abdomen - soft, nontender, nondistended, no masses or organomegaly   ASSESSMENT AND PLAN: 1. Gastroesophageal reflux disease without esophagitis -GERD precautions reinforced. He will continue with just Pepcid twice a day.  2. HYPOTHYROIDISM, POST-RADIOACTIVE IODINE Refill given on levothyroxine.  Patient was given the opportunity to ask questions.  Patient verbalized understanding of the plan and was able to repeat key elements of the plan.   No orders of the defined types were placed in this encounter.    Requested Prescriptions   Signed Prescriptions Disp Refills  . levothyroxine (SYNTHROID, LEVOTHROID) 150 MCG tablet 90 tablet 3    Sig: Take 1 tablet (150 mcg total) by mouth daily.    Return in about 5 months (around 11/25/2016).  Jonah Blueeborah Johnson, MD, FACP

## 2016-07-24 MED FILL — FAMOTIDINE 20 MG TABLET: 20 | 30 days supply | Qty: 60 | Fill #2

## 2016-12-20 ENCOUNTER — Encounter: Payer: Self-pay | Admitting: Internal Medicine

## 2016-12-20 ENCOUNTER — Ambulatory Visit: Payer: Self-pay | Attending: Internal Medicine | Admitting: Internal Medicine

## 2016-12-20 VITALS — BP 123/74 | HR 73 | Temp 98.2°F | Resp 18 | Ht 68.0 in | Wt 197.0 lb

## 2016-12-20 DIAGNOSIS — R0602 Shortness of breath: Secondary | ICD-10-CM | POA: Insufficient documentation

## 2016-12-20 DIAGNOSIS — J069 Acute upper respiratory infection, unspecified: Secondary | ICD-10-CM | POA: Insufficient documentation

## 2016-12-20 DIAGNOSIS — R1013 Epigastric pain: Secondary | ICD-10-CM | POA: Insufficient documentation

## 2016-12-20 DIAGNOSIS — B9789 Other viral agents as the cause of diseases classified elsewhere: Secondary | ICD-10-CM

## 2016-12-20 DIAGNOSIS — Z79899 Other long term (current) drug therapy: Secondary | ICD-10-CM | POA: Insufficient documentation

## 2016-12-20 DIAGNOSIS — E039 Hypothyroidism, unspecified: Secondary | ICD-10-CM | POA: Insufficient documentation

## 2016-12-20 DIAGNOSIS — H6983 Other specified disorders of Eustachian tube, bilateral: Secondary | ICD-10-CM | POA: Insufficient documentation

## 2016-12-20 DIAGNOSIS — Z87891 Personal history of nicotine dependence: Secondary | ICD-10-CM | POA: Insufficient documentation

## 2016-12-20 DIAGNOSIS — E018 Other iodine-deficiency related thyroid disorders and allied conditions: Secondary | ICD-10-CM

## 2016-12-20 MED ORDER — BENZONATATE 100 MG PO CAPS: 100.0000 mg | ORAL_CAPSULE | Freq: Two times a day (BID) | ORAL | 0 refills | Status: DC | PRN

## 2016-12-20 MED ORDER — PSEUDOEPHEDRINE HCL 60 MG PO TABS: 60.0000 mg | ORAL_TABLET | Freq: Three times a day (TID) | ORAL | 0 refills | Status: DC | PRN

## 2016-12-20 MED FILL — BENZONATATE 100 MG CAPSULE: 100 | 10 days supply | Qty: 20 | Fill #0

## 2016-12-20 NOTE — Patient Instructions (Signed)
Infección del tracto respiratorio superior, adultos  (Upper Respiratory Infection, Adult)  La mayoría de las infecciones del tracto respiratorio superior están causadas por un virus. Un infección del tracto respiratorio superior afecta la nariz, la garganta y las vías respiratorias superiores. El tipo más común de infección del tracto respiratorio superior es el resfrío común.  CUIDADOS EN EL HOGAR  · Tome los medicamentos solamente como se lo haya indicado el médico.  · A fin de aliviar el dolor de garganta, haga gárgaras con solución salina templada o consuma caramelos para la tos, como se lo haya indicado el médico.  · Use un humidificador de vapor cálido o inhale el vapor de la ducha para aumentar la humedad del aire. Esto facilitará la respiración.  · Beba suficiente líquido para mantener el pis (orina) claro o de color amarillo pálido.  · Tome sopas y caldos transparentes.  · Siga una dieta saludable.  · Descanse todo lo que sea necesario.  · Regrese al trabajo cuando la fiebre haya desaparecido o el médico le diga que puede hacerlo.  ? Es posible que deba quedarse en su casa durante un tiempo prolongado, para no transmitir la infección a los demás.  ? También puede usar un barbijo y lavarse las manos con frecuencia para evitar el contagio del virus.  · Si tiene asma, use el inhalador con mayor frecuencia.  · No consuma ningún producto que contenga tabaco, lo que incluye cigarrillos, tabaco de mascar o cigarrillos electrónicos. Si necesita ayuda para dejar de fumar, consulte al médico.  SOLICITE AYUDA SI:  · Siente que empeora o que no mejora.  · Los medicamentos no logran aliviar los síntomas.  · Tiene escalofríos.  · La dificultad para respirar es peor.  · Tiene mucosidad marrón o roja.  · Tiene una secreción amarilla o marrón de la nariz.  · Le duele la cara, especialmente al inclinarse hacia adelante.  · Tiene fiebre.  · Tiene los ganglios del cuello hinchados.  · Siente dolor al tragar.   · Tiene zonas blancas en la parte de atrás de la garganta.  SOLICITE AYUDA DE INMEDIATO SI:  · Los siguientes síntomas son muy intensos o constantes:  ? Dolor de cabeza.  ? Dolor de oídos.  ? Dolor en la frente, detrás de los ojos y por encima de los pómulos (dolor sinusal).  ? Dolor en el pecho.  · Tiene enfermedad pulmonar prolongada (crónica) y cualquiera de estos síntomas:  ? Sibilancias.  ? Tos prolongada.  ? Tos con sangre.  ? Cambio en la mucosidad habitual.  · Presenta rigidez en el cuello.  · Tiene cambios en:  ? La visión.  ? La audición.  ? El pensamiento.  ? El estado de ánimo.  ASEGÚRESE DE QUE:  · Comprende estas instrucciones.  · Controlará su afección.  · Recibirá ayuda de inmediato si no mejora o si empeora.  Esta información no tiene como fin reemplazar el consejo del médico. Asegúrese de hacerle al médico cualquier pregunta que tenga.  Document Released: 06/04/2010 Document Revised: 05/17/2014 Document Reviewed: 04/07/2013  Elsevier Interactive Patient Education © 2018 Elsevier Inc.

## 2016-12-20 NOTE — Progress Notes (Signed)
Patient ID: Daniel Fuller, male    DOB: 19-Feb-1975  MRN: [id number]  CC: URI   Subjective: Daniel Fuller is a 41 y.o. male who presents for UC His concerns today include:   Pt c/o having a cold for 4-6 wks Symptoms include chest/nasal congestion, ears stopped up and sore throat -little cough and SOB. No facial pain. Rhinorrhea off and on -had fever for 2 days when it first started.  -taking some OTC [place] remedies which helped a little  2. Hypothyroid: compliant with med. Would like level check -no palpitations. He has had some wgh gain Patient Active Problem List   Diagnosis Date Noted  . Bacterial infection due to H. pylori 05/27/2016  . Epigastric pain 04/15/2016  . HYPOTHYROIDISM, POST-RADIOACTIVE IODINE 01/05/2007     Current Outpatient Medications on File Prior to Visit  Medication Sig Dispense Refill  . famotidine (PEPCID) 20 MG tablet Take 1 tablet (20 mg total) by mouth 2 (two) times daily. 60 tablet 2  . levothyroxine (SYNTHROID, LEVOTHROID) 150 MCG tablet Take 1 tablet (150 mcg total) by mouth daily. 90 tablet 3  . diclofenac sodium (VOLTAREN) 1 % GEL Apply 2 g topically 4 (four) times daily. (Patient not taking: Reported on 06/25/2016) 100 g 2   No current facility-administered medications on file prior to visit.     No Known Allergies  Social History   Socioeconomic History  . Marital status: Married    Spouse name: Not on file  . Number of children: Not on file  . Years of education: Not on file  . Highest education level: Not on file  Social Needs  . Financial resource strain: Not on file  . Food insecurity - worry: Not on file  . Food insecurity - inability: Not on file  . Transportation needs - medical: Not on file  . Transportation needs - non-medical: Not on file  Occupational History  . Not on file  Tobacco Use  . Smoking status: Former Smoker    Packs/day: 0.40    Years: 6.00    Pack years: 2.40    Types:  Cigarettes    Start date: 08/06/1994    Last attempt to quit: 08/05/2000    Years since quitting: 16.3  . Smokeless tobacco: Current User  Substance and Sexual Activity  . Alcohol use: Yes  . Drug use: No  . Sexual activity: Yes  Other Topics Concern  . Not on file  Social History Narrative  . Not on file    History reviewed. No pertinent family history.  History reviewed. No pertinent surgical history.  ROS: Review of Systems As above PHYSICAL EXAM: BP 123/74 (BP Location: Left Arm, Patient Position: Sitting, Cuff Size: Large)   Pulse 73   Temp 98.2 F (36.8 C) (Oral)   Resp 18   Ht 5\' 8"  (1.727 m)   Wt 197 lb (89.4 kg)   SpO2 98%   BMI 29.95 kg/m   Wt Readings from Last 3 Encounters:  12/20/16 197 lb (89.4 kg)  06/25/16 190 lb (86.2 kg)  05/27/16 191 lb 3.2 oz (86.7 kg)    Physical Exam  General appearance - alert, well appearing, and in no distress Mental status - alert, oriented to person, place, and time, normal mood, behavior, speech, dress, motor activity, and thought processes Ears - bilateral TM's and external ear canals normal Nose -mild enlargement of nasal turbinates Mouth - mucous membranes moist, pharynx normal without lesions. No exudates Neck - supple, no  significant adenopathy Chest - clear to auscultation, no wheezes, rales or rhonchi, symmetric air entry Heart - normal rate, regular rhythm, normal S1, S2, no murmurs, rubs, clicks or gallops Abdomen - soft, nontender, nondistended, no masses or organomegaly   ASSESSMENT AND PLAN: 1. Viral URI with cough - benzonatate (TESSALON) 100 MG capsule; Take 1 capsule (100 mg total) by mouth 2 (two) times daily as needed for cough.  Dispense: 20 capsule; Refill: 0 - pseudoephedrine (SUDAFED) 60 MG tablet; Take 1 tablet (60 mg total) by mouth every 8 (eight) hours as needed for congestion.  Dispense: 15 tablet; Refill: 0  2. HYPOTHYROIDISM, POST-RADIOACTIVE IODINE - TSH  3. Dysfunction of both  eustachian tubes   Patient was given the opportunity to ask questions.  Patient verbalized understanding of the plan and was able to repeat key elements of the plan.   No orders of the defined types were placed in this encounter.    Requested Prescriptions    No prescriptions requested or ordered in this encounter    F/u PRN Jonah Blueeborah Makynzee Tigges, MD, Jerrel IvoryFACP

## 2016-12-21 LAB — TSH: TSH: 0.155 u[IU]/mL — ABNORMAL LOW (ref 0.450–4.500)

## 2016-12-22 ENCOUNTER — Other Ambulatory Visit: Payer: Self-pay | Admitting: Internal Medicine

## 2016-12-22 DIAGNOSIS — E039 Hypothyroidism, unspecified: Secondary | ICD-10-CM

## 2016-12-22 MED ORDER — LEVOTHYROXINE SODIUM 125 MCG PO TABS: 125.0000 ug | ORAL_TABLET | Freq: Every day | ORAL | 3 refills | Status: DC

## 2016-12-23 MED FILL — ?LEVOTHYROXINE 125 MCG TABL: 125 | 30 days supply | Qty: 30 | Fill #0

## 2016-12-25 ENCOUNTER — Telehealth: Payer: Self-pay

## 2016-12-25 NOTE — Telephone Encounter (Signed)
-----   Message from Deborah B Johnson, MD sent at 12/22/2016  7:26 PM EST ----- Let pt know that his thyroid level is not normal. We need to decrease the dose of the Levothyroxine from 150 mcg to 125 mcg immediately.  I have sent the new prescription to our pharmacy. He must return to the laboratory after he has been on the 125 mcg for 1 mth for recheck of thyroid level. 

## 2016-12-25 NOTE — Telephone Encounter (Signed)
-----   Message from Marcine Matareborah B Johnson, MD sent at 12/22/2016  7:26 PM EST ----- Let pt know that his thyroid level is not normal. We need to decrease the dose of the Levothyroxine from 150 mcg to 125 mcg immediately.  I have sent the new prescription to our pharmacy. He must return to the laboratory after he has been on the 125 mcg for 1 mth for recheck of thyroid level.

## 2016-12-25 NOTE — Telephone Encounter (Signed)
Patient informed on lab result. Patient aware to pick up his new Levothyroxine Rx.  Patient verified DOB.

## 2017-01-22 MED FILL — ?LEVOTHYROXINE 125 MCG TABL: 125 | 30 days supply | Qty: 30 | Fill #1

## 2017-01-24 ENCOUNTER — Encounter: Payer: Self-pay | Admitting: Internal Medicine

## 2017-01-24 ENCOUNTER — Ambulatory Visit: Payer: Self-pay | Attending: Internal Medicine | Admitting: Internal Medicine

## 2017-01-24 DIAGNOSIS — E039 Hypothyroidism, unspecified: Secondary | ICD-10-CM | POA: Insufficient documentation

## 2017-01-24 DIAGNOSIS — R1013 Epigastric pain: Secondary | ICD-10-CM | POA: Insufficient documentation

## 2017-01-24 DIAGNOSIS — K219 Gastro-esophageal reflux disease without esophagitis: Secondary | ICD-10-CM | POA: Insufficient documentation

## 2017-01-24 DIAGNOSIS — B9681 Helicobacter pylori [H. pylori] as the cause of diseases classified elsewhere: Secondary | ICD-10-CM | POA: Insufficient documentation

## 2017-01-24 DIAGNOSIS — Z87891 Personal history of nicotine dependence: Secondary | ICD-10-CM | POA: Insufficient documentation

## 2017-01-24 DIAGNOSIS — R109 Unspecified abdominal pain: Secondary | ICD-10-CM

## 2017-01-24 MED ORDER — OMEPRAZOLE 20 MG PO CPDR: 20.0000 mg | DELAYED_RELEASE_CAPSULE | Freq: Every day | ORAL | 3 refills | Status: DC

## 2017-01-24 MED FILL — ?OMEPRAZOLE DR 20MG CAPSULE: 20 | 30 days supply | Qty: 30 | Fill #0

## 2017-01-24 NOTE — Patient Instructions (Signed)
Esófago de Barrett  (Barrett Esophagus)  El esófago de Barrett aparece cuando el tejido que recubre el esófago se modifica o se daña. El esófago es el tubo que transporta los alimentos desde la boca al estómago. Con el esófago de Barrett, las células que recubren el esófago son reemplazadas por células similares a la mucosa del intestino (metaplasia intestinal).  Es posible que el esófago de Barrett por sí solo no cause síntomas. Sin embargo, muchas personas con esta afección también tienen enfermedad por reflujo gastroesofágico (ERGE), que puede causar síntomas como, por ejemplo, la acidez estomacal. El tratamiento puede incluir medicamentos, procedimientos para destruir las células anormales o cirugía. Con el tiempo, algunas personas con esta afección pueden tener cáncer de esófago.  CAUSAS  Se desconoce la causa exacta de esta afección. En algunos casos, se produce a partir de daños en el revestimiento del esófago a causa de la ERGE. La ERGE ocurre cuando los ácidos estomacales retroceden desde el estómago al esófago. Los síntomas frecuentes de ERGE pueden causar metaplasia intestinal o cambios a nivel celular (displasia).  FACTORES DE RIESGO  Los siguientes factores pueden hacer que usted sea propenso a sufrir esta afección:  · Tener ERGE.  · Tener cualquiera de las siguientes características:  ? Ser hombre.  ? Ser blanco (caucásico).  ? Obesos.  ? Ser mayor de 50 años.  · Tener una hernia de hiato.  · Fumar.  SÍNTOMAS  Con frecuencia, las personas que tienen esófago de Barrett no tienen síntomas. Sin embargo, muchas personas que padecen esta afección también tienen ERGE. Los síntomas de ERGE pueden incluir lo siguiente:  · Acidez estomacal.  · Dificultad para tragar.  · Tos seca.  DIAGNÓSTICO  El diagnóstico del esófago de Barrett se puede realizar por medio de un examen llamado endoscopia gastrointestinal superior. Durante este examen, se introduce un tubo delgado y flexible (endoscopio) hasta llegar al  esófago. El endoscopio tiene una luz y una cámara en el extremo. El médico usa el endoscopio para observar el interior del esófago. Durante el examen, se tomarán varias muestras de tejido (biopsias) del esófago para poder analizarlas en busca de metaplasia intestinal o displasia.  TRATAMIENTO  El tratamiento de esta afección puede incluir lo siguiente:  · Medicamentos (inhibidores de la bomba de protones o IBP) para reducir o detener la ERGE.  · Exámenes endoscópicos periódicos para asegurarse de que no aparezca cáncer.  · Un procedimiento o una cirugía para la displasia. Esto puede incluir lo siguiente:  ? La extirpación o destrucción endoscópica de las células anormales.  ? La extirpación de parte del esófago (esofagectomía).  INSTRUCCIONES PARA EL CUIDADO EN EL HOGAR  Comida y bebida  · Coma más frutas y verduras frescas.  · Evite los alimentos grasosos.  · Haga comidas pequeñas durante el día en lugar de comidas abundantes.  · No consuma los alimentos que le causan acidez estomacal. Ellos son:  ? Café y bebidas alcohólicas.  ? Tomates y comidas hechas con tomates.  ? Comidas grasosas o picantes.  ? Chocolate y menta.  Instrucciones generales  · Tome los medicamentos de venta libre y los recetados solamente como se lo haya indicado el médico.  · No consuma ningún producto que contenga tabaco, lo que incluye cigarrillos, tabaco de mascar y cigarrillos electrónicos. Si necesita ayuda para dejar de fumar, consulte al médico.  · Si el médico lo está tratando por ERGE, siga todas las instrucciones y tome los medicamentos como se lo hayan indicado.  · Concurra a   todas las visitas de control como se lo haya indicado el médico. Esto es importante.  SOLICITE ATENCIÓN MÉDICA SI:  · Tiene acidez estomacal o síntomas de ERGE.  · Tiene dificultad para tragar.  SOLICITE ATENCIÓN MÉDICA DE INMEDIATO SI:  · Siente dolor en el pecho.  · No puede tragar.  · Vomita sangre de color rojo brillante o una sustancia similar al poso  del café.  · La materia fecal (heces) son de color rojo brillante u oscuras.  Esta información no tiene como fin reemplazar el consejo del médico. Asegúrese de hacerle al médico cualquier pregunta que tenga.  Document Released: 07/02/2011 Document Revised: 04/24/2015 Document Reviewed: 10/13/2014  Elsevier Interactive Patient Education © 2018 Elsevier Inc.

## 2017-01-24 NOTE — Progress Notes (Signed)
Patient ID: Daniel Fuller, male    DOB: 04/30/1975  MRN: [id number]  CC: No chief complaint on file.   Subjective: Daniel Fuller is a 42 y.o. male who presents for UC visit His concerns today include:   1. Pt complains of intermittent burning pain in lower chest and epigastric area.  This has been occurring for the past 2-3 weeks. No N/V.  No relation to activity.  No radiation of the neck or down the arms. Comes on with  certain foods especially greesy foods  2.  Also complains of intermittent pain on either side of the flank.  No initiating factors per se.  Patient is vague in describing how often it occurs.  States that it alternates from one side to the other and lasts only a few minutes.  Denies any dysuria or penile discharge.  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  He is worried that something detrimental to his health may be occuring Patient Active Problem List   Diagnosis Date Noted  . Bacterial infection due to H. pylori 05/27/2016  . Epigastric pain 04/15/2016  . HYPOTHYROIDISM, POST-RADIOACTIVE IODINE 01/05/2007     Current Outpatient Medications on File Prior to Visit  Medication Sig Dispense Refill  . benzonatate (TESSALON) 100 MG capsule Take 1 capsule (100 mg total) by mouth 2 (two) times daily as needed for cough. 20 capsule 0  . diclofenac sodium (VOLTAREN) 1 % GEL Apply 2 g topically 4 (four) times daily. (Patient not taking: Reported on 06/25/2016) 100 g 2  . famotidine (PEPCID) 20 MG tablet Take 1 tablet (20 mg total) by mouth 2 (two) times daily. 60 tablet 2  . levothyroxine (SYNTHROID, LEVOTHROID) 125 MCG tablet Take 1 tablet (125 mcg total) by mouth daily. 30 tablet 3  . pseudoephedrine (SUDAFED) 60 MG tablet Take 1 tablet (60 mg total) by mouth every 8 (eight) hours as needed for congestion. 15 tablet 0   No current facility-administered medications on file prior to visit.     No Known  Allergies  Social History   Socioeconomic History  . Marital status: Married    Spouse name: Not on file  . Number of children: Not on file  . Years of education: Not on file  . Highest education level: Not on file  Social Needs  . Financial resource strain: Not on file  . Food insecurity - worry: Not on file  . Food insecurity - inability: Not on file  . Transportation needs - medical: Not on file  . Transportation needs - non-medical: Not on file  Occupational History  . Not on file  Tobacco Use  . Smoking status: Former Smoker    Packs/day: 0.40    Years: 6.00    Pack years: 2.40    Types: Cigarettes    Start date: 08/06/1994    Last attempt to quit: 08/05/2000    Years since quitting: 16.4  . Smokeless tobacco: Current User  Substance and Sexual Activity  . Alcohol use: Yes  . Drug use: No  . Sexual activity: Yes  Other Topics Concern  . Not on file  Social History Narrative  . Not on file    No family history on file.  No past surgical history on file.  ROS: Review of Systems As above PHYSICAL EXAM: There were no vitals taken for this visit.  Physical Exam  General appearance - alert, well appearing, and in no  distress Mental status - alert, oriented to person, place, and time, normal mood, behavior, speech, dress, motor activity, and thought processes Neck - supple, no significant adenopathy Chest - clear to auscultation, no wheezes, rales or rhonchi, symmetric air entry Heart - normal rate, regular rhythm, normal S1, S2, no murmurs, rubs, clicks or gallops Abdomen - soft, nontender, nondistended, no masses or organomegaly Musculoskeletal - no flank tenderness  ASSESSMENT AND PLAN: 1. Gastroesophageal reflux disease without esophagitis -GERD precautions discussed including foods to avoid. Start omeprazole daily. Avoid NSAIDs 2. Side pain Likely MSK in nature.  I recommend Tylenol over-the-counter as needed  Patient was given the opportunity to ask  questions.  Patient verbalized understanding of the plan and was able to repeat key elements of the plan.   No orders of the defined types were placed in this encounter.    Requested Prescriptions   Signed Prescriptions Disp Refills  . omeprazole (PRILOSEC) 20 MG capsule 30 capsule 3    Sig: Take 1 capsule (20 mg total) by mouth daily.    No Follow-up on file.  Jonah Blue, MD, FACP

## 2017-02-11 ENCOUNTER — Encounter (HOSPITAL_COMMUNITY): Payer: Self-pay | Admitting: Emergency Medicine

## 2017-02-11 ENCOUNTER — Ambulatory Visit: Payer: Self-pay | Admitting: Internal Medicine

## 2017-02-11 ENCOUNTER — Ambulatory Visit (HOSPITAL_COMMUNITY)
Admission: EM | Admit: 2017-02-11 | Discharge: 2017-02-11 | Disposition: A | Discharge status: Home / Self Care | Payer: Self-pay | Attending: Family Medicine | Admitting: Family Medicine

## 2017-02-11 DIAGNOSIS — K2970 Gastritis, unspecified, without bleeding: Secondary | ICD-10-CM

## 2017-02-11 DIAGNOSIS — R69 Illness, unspecified: Secondary | ICD-10-CM

## 2017-02-11 DIAGNOSIS — J111 Influenza due to unidentified influenza virus with other respiratory manifestations: Secondary | ICD-10-CM

## 2017-02-11 MED ORDER — OMEPRAZOLE 40 MG PO CPDR: 40.0000 mg | DELAYED_RELEASE_CAPSULE | Freq: Every day | ORAL | 0 refills | Status: DC

## 2017-02-11 MED ORDER — ACETAMINOPHEN 325 MG PO TABS
975.0000 mg | ORAL_TABLET | Freq: Once | ORAL | Status: AC
  Administered 2017-02-11: 975 mg via ORAL

## 2017-02-11 MED ORDER — ACETAMINOPHEN 500 MG PO TABS: 1000.0000 mg | ORAL_TABLET | Freq: Three times a day (TID) | ORAL | 0 refills | Status: DC | PRN

## 2017-02-11 MED ORDER — GI COCKTAIL ~~LOC~~
ORAL | Status: AC
  Filled 2017-02-11: qty 30

## 2017-02-11 MED ORDER — GI COCKTAIL ~~LOC~~
30.0000 mL | Freq: Once | ORAL | Status: AC
  Administered 2017-02-11: 30 mL via ORAL

## 2017-02-11 MED ORDER — ACETAMINOPHEN 325 MG PO TABS
ORAL_TABLET | ORAL | Status: AC
  Filled 2017-02-11: qty 3

## 2017-02-11 NOTE — ED Provider Notes (Signed)
[facility]    CSN: [id number] Arrival date & time: 02/11/17  1000     History   Chief Complaint Chief Complaint  Patient presents with  . Fever  . Cough    HPI Daniel Fuller is a 42 y.o. male.   Daniel Fuller presents with complaints of productive cough, headache and fever which started two days ago. Fatigued. Son is also ill with a cough. without runny nose or ear pain. Mild sore throat. Did not get a flu shot this season. Has not taken any medications for his symptoms.   History of gerd, has been treated for h. Pylori in the past. Complaints of persistent epigastric pain, occasional nausea. Occasional diarrhea, states has noticed black stools approximately twice in the past. Rates pain 6/10 currently. It comes and goes. Radiates to bilateral flanks at times. Without vomiting. Takes daily omeprazole. Pain is worse after eating.    ROS per HPI.       Past Medical History:  Diagnosis Date  . Thyroid disease     Patient Active Problem List   Diagnosis Date Noted  . Bacterial infection due to H. pylori 05/27/2016  . Epigastric pain 04/15/2016  . HYPOTHYROIDISM, POST-RADIOACTIVE IODINE 01/05/2007    History reviewed. No pertinent surgical history.     Home Medications    Prior to Admission medications   Medication Sig Start Date End Date Taking? Authorizing Provider  levothyroxine (SYNTHROID, LEVOTHROID) 125 MCG tablet Take 1 tablet (125 mcg total) by mouth daily. 12/22/16  Yes Marcine MatarJohnson, Deborah B, MD  omeprazole (PRILOSEC) 20 MG capsule Take 1 capsule (20 mg total) by mouth daily. 01/24/17  Yes Marcine MatarJohnson, Deborah B, MD  acetaminophen (TYLENOL) 500 MG tablet Take 2 tablets (1,000 mg total) by mouth every 8 (eight) hours as needed for mild pain or fever. 02/11/17   Georgetta HaberBurky, Davarius Ridener B, NP  benzonatate (TESSALON) 100 MG capsule Take 1 capsule (100 mg total) by mouth 2 (two) times daily as needed for cough. 12/20/16   Marcine MatarJohnson, Deborah B, MD  diclofenac sodium  (VOLTAREN) 1 % GEL Apply 2 g topically 4 (four) times daily. Patient not taking: Reported on 06/25/2016 05/06/16   Pete GlatterLangeland, Dawn T, MD  famotidine (PEPCID) 20 MG tablet Take 1 tablet (20 mg total) by mouth 2 (two) times daily. 05/27/16   Pete GlatterLangeland, Dawn T, MD  omeprazole (PRILOSEC) 40 MG capsule Take 1 capsule (40 mg total) by mouth daily. 02/11/17   Georgetta HaberBurky, Gregg Holster B, NP  pseudoephedrine (SUDAFED) 60 MG tablet Take 1 tablet (60 mg total) by mouth every 8 (eight) hours as needed for congestion. 12/20/16   Marcine MatarJohnson, Deborah B, MD    Family History No family history on file.  Social History Social History   Tobacco Use  . Smoking status: Former Smoker    Packs/day: 0.40    Years: 6.00    Pack years: 2.40    Types: Cigarettes    Start date: 08/06/1994    Last attempt to quit: 08/05/2000    Years since quitting: 16.5  . Smokeless tobacco: Current User  Substance Use Topics  . Alcohol use: Yes  . Drug use: No     Allergies   Patient has no known allergies.   Review of Systems Review of Systems   Physical Exam Triage Vital Signs ED Triage Vitals  Enc Vitals Group     BP 02/11/17 1020 130/83     Pulse Rate 02/11/17 1020 92     Resp 02/11/17 1020 18  Temp 02/11/17 1020 (!) 101.9 F (38.8 C)     Temp Source 02/11/17 1020 Oral     SpO2 02/11/17 1020 97 %     Weight 02/11/17 1021 193 lb (87.5 kg)     Height --      Head Circumference --      Peak Flow --      Pain Score 02/11/17 1021 6     Pain Loc --      Pain Edu? --      Excl. in GC? --    No data found.  Updated Vital Signs BP 130/83 (BP Location: Right Arm)   Pulse 92   Temp (!) 101.9 F (38.8 C) (Oral) Comment: Notified [place]  Resp 18   Wt 193 lb (87.5 kg)   SpO2 97%   BMI 29.35 kg/m   Visual Acuity Right Eye Distance:   Left Eye Distance:   Bilateral Distance:    Right Eye Near:   Left Eye Near:    Bilateral Near:     Physical Exam  Constitutional: He is oriented to person, place, and time.  He appears well-developed and well-nourished.  HENT:  Head: Normocephalic and atraumatic.  Right Ear: Tympanic membrane, external ear and ear canal normal.  Left Ear: Tympanic membrane, external ear and ear canal normal.  Nose: Nose normal. Right sinus exhibits no maxillary sinus tenderness and no frontal sinus tenderness. Left sinus exhibits no maxillary sinus tenderness and no frontal sinus tenderness.  Mouth/Throat: Uvula is midline, oropharynx is clear and moist and mucous membranes are normal.  Eyes: Conjunctivae are normal. Pupils are equal, round, and reactive to light.  Neck: Normal range of motion.  Cardiovascular: Normal rate and regular rhythm.  Pulmonary/Chest: Effort normal and breath sounds normal.  Abdominal: There is generalized tenderness.  Lymphadenopathy:    He has no cervical adenopathy.  Neurological: He is alert and oriented to person, place, and time.  Skin: Skin is warm and dry.  Vitals reviewed.    UC Treatments / Results  Labs (all labs ordered are listed, but only abnormal results are displayed) Labs Reviewed - No data to display  EKG  EKG Interpretation None       Radiology No results found.  Procedures Procedures (including critical care time)  Medications Ordered in UC Medications  acetaminophen (TYLENOL) tablet 975 mg (975 mg Oral Given 02/11/17 1030)  gi cocktail (Maalox,Lidocaine,Donnatal) (30 mLs Oral Given 02/11/17 1047)     Initial Impression / Assessment and Plan / UC Course  I have reviewed the triage vital signs and the nursing notes.  Pertinent labs & imaging results that were available during my care of the patient were reviewed by me and considered in my medical decision making (see chart for details).     Fever noted, tylenol provided in clinic. Has had some black stools, will increase omeprazole to 40mg  for concern for ulcer. Gi cocktail provided, patient was called to pick his son up from school so departed prior to  reassess. Appears that this is influenza like URI as well as acute on chronic gastritis. Patient states he has a follow up appointment in two days with his PCP. Return precautions provided. Patient verbalized understanding and agreeable to plan.    Final Clinical Impressions(s) / UC Diagnoses   Final diagnoses:  Influenza-like illness  Gastritis, presence of bleeding unspecified, unspecified chronicity, unspecified gastritis type    ED Discharge Orders        Ordered  omeprazole (PRILOSEC) 40 MG capsule  Daily     02/11/17 1042    acetaminophen (TYLENOL) 500 MG tablet  Every 8 hours PRN     02/11/17 1042       Controlled Substance Prescriptions [place] Controlled Substance Registry consulted? Not Applicable   Georgetta Haber, NP 02/11/17 1052

## 2017-02-11 NOTE — ED Triage Notes (Signed)
PT reports productive cough since Sunday. PT reports fever onset at same time.  PT also complains of "ulcer pain" in epigastric area.  Denies sore throat.

## 2017-02-11 NOTE — ED Notes (Signed)
PT refuses tylenol or ibuprofen at this time. Wishes to speak to doctor about it first

## 2017-02-11 NOTE — Discharge Instructions (Signed)
Push fluids to ensure adequate hydration and keep secretions thin.  Tylenol as needed for pain or fevers.  Please increase your omeprazole to 40mg  a day. See provided diet recommendations for GERD. Continue to follow up with your primary care provider for reevaluation. If develop increased pain, vomiting, blood in stool, blood in vomit, persistent fevers, shortness of breath, chest pain, or otherwise worsening please return to be seen or go to Er.

## 2017-02-17 MED FILL — ?LEVOTHYROXINE 125 MCG TABL: 125 | 30 days supply | Qty: 30 | Fill #2

## 2017-03-18 MED FILL — ?LEVOTHYROXINE 125 MCG TABL: 125 | 30 days supply | Qty: 30 | Fill #3

## 2017-04-01 ENCOUNTER — Other Ambulatory Visit: Payer: Self-pay | Admitting: Gastroenterology

## 2017-04-02 ENCOUNTER — Other Ambulatory Visit: Payer: Self-pay | Admitting: Gastroenterology

## 2017-04-02 DIAGNOSIS — R109 Unspecified abdominal pain: Secondary | ICD-10-CM

## 2017-04-12 ENCOUNTER — Ambulatory Visit
Admission: RE | Admit: 2017-04-12 | Discharge: 2017-04-12 | Disposition: A | Discharge status: Home / Self Care | Payer: Self-pay | Source: Ambulatory Visit | Attending: Gastroenterology | Admitting: Gastroenterology

## 2017-04-12 DIAGNOSIS — R109 Unspecified abdominal pain: Secondary | ICD-10-CM

## 2017-04-12 MED ORDER — IOPAMIDOL (ISOVUE-300) INJECTION 61%
100.0000 mL | Freq: Once | INTRAVENOUS | Status: AC | PRN
  Administered 2017-04-12: 100 mL via INTRAVENOUS

## 2017-04-18 ENCOUNTER — Telehealth: Payer: Self-pay | Admitting: Internal Medicine

## 2017-04-18 NOTE — Telephone Encounter (Signed)
Patient called requesting a refill on levothyroxine please call patient when refill is ready

## 2017-04-21 ENCOUNTER — Ambulatory Visit: Payer: Self-pay | Attending: Internal Medicine

## 2017-04-21 ENCOUNTER — Telehealth: Payer: Self-pay | Admitting: Internal Medicine

## 2017-04-21 DIAGNOSIS — E039 Hypothyroidism, unspecified: Secondary | ICD-10-CM

## 2017-04-21 MED ORDER — LEVOTHYROXINE SODIUM 125 MCG PO TABS: 125.0000 ug | ORAL_TABLET | Freq: Every day | ORAL | 0 refills | Status: DC

## 2017-04-21 MED FILL — LEVOTHYROXINE 125 MCG TAB: 125 | 30 days supply | Qty: 30 | Fill #0

## 2017-04-21 NOTE — Telephone Encounter (Signed)
Contacted pt and informed pt that refill has been sent and we schedule him a lab appointment for today

## 2017-04-21 NOTE — Telephone Encounter (Signed)
Pt came into the office today for lab work  Pt is requesting a referral to orhto or chiropractor for his back and side pain. Please f/u

## 2017-04-22 LAB — TSH+T4F+T3FREE
Free T4: 1.46 ng/dL (ref 0.82–1.77)
T3 FREE: 2.9 pg/mL (ref 2.0–4.4)
TSH: 2.89 u[IU]/mL (ref 0.450–4.500)

## 2017-04-22 NOTE — Telephone Encounter (Signed)
Contacted pt and went over lab results

## 2017-05-20 ENCOUNTER — Other Ambulatory Visit: Payer: Self-pay | Admitting: Internal Medicine

## 2017-05-20 MED FILL — LEVOTHYROXINE 125 MCG TAB: 125 | 30 days supply | Qty: 30 | Fill #0

## 2017-06-18 MED FILL — LEVOTHYROXINE 125 MCG TAB: 125 | 30 days supply | Qty: 30 | Fill #1

## 2017-07-18 MED FILL — LEVOTHYROXINE 125 MCG TAB: 125 | 30 days supply | Qty: 30 | Fill #2

## 2017-08-15 MED FILL — LEVOTHYROXINE 125 MCG TAB: 125 | 30 days supply | Qty: 30 | Fill #3

## 2017-09-16 MED FILL — LEVOTHYROXINE 125 MCG TAB: 125 | 30 days supply | Qty: 30 | Fill #4

## 2017-10-14 MED FILL — LEVOTHYROXINE 125 MCG TAB: 125 | 30 days supply | Qty: 30 | Fill #5

## 2017-11-17 ENCOUNTER — Other Ambulatory Visit: Payer: Self-pay | Admitting: Internal Medicine

## 2017-11-17 MED FILL — LEVOTHYROXINE 125 MCG TAB: 125 | 30 days supply | Qty: 30 | Fill #0

## 2017-11-25 ENCOUNTER — Ambulatory Visit: Payer: Self-pay | Admitting: Internal Medicine

## 2017-12-15 MED FILL — LEVOTHYROXINE 125 MCG TAB: 125 | 30 days supply | Qty: 30 | Fill #1

## 2018-01-13 ENCOUNTER — Ambulatory Visit: Payer: Self-pay | Attending: Internal Medicine | Admitting: Internal Medicine

## 2018-01-13 ENCOUNTER — Encounter: Payer: Self-pay | Admitting: Internal Medicine

## 2018-01-13 VITALS — BP 127/73 | HR 62 | Temp 99.2°F | Resp 16 | Wt 210.0 lb

## 2018-01-13 DIAGNOSIS — Z6831 Body mass index (BMI) 31.0-31.9, adult: Secondary | ICD-10-CM

## 2018-01-13 DIAGNOSIS — Z79890 Hormone replacement therapy (postmenopausal): Secondary | ICD-10-CM | POA: Insufficient documentation

## 2018-01-13 DIAGNOSIS — Z1331 Encounter for screening for depression: Secondary | ICD-10-CM | POA: Insufficient documentation

## 2018-01-13 DIAGNOSIS — E669 Obesity, unspecified: Secondary | ICD-10-CM | POA: Insufficient documentation

## 2018-01-13 DIAGNOSIS — Z79899 Other long term (current) drug therapy: Secondary | ICD-10-CM | POA: Insufficient documentation

## 2018-01-13 DIAGNOSIS — E018 Other iodine-deficiency related thyroid disorders and allied conditions: Secondary | ICD-10-CM | POA: Insufficient documentation

## 2018-01-13 DIAGNOSIS — Z2821 Immunization not carried out because of patient refusal: Secondary | ICD-10-CM

## 2018-01-13 DIAGNOSIS — F329 Major depressive disorder, single episode, unspecified: Secondary | ICD-10-CM | POA: Insufficient documentation

## 2018-01-13 DIAGNOSIS — L290 Pruritus ani: Secondary | ICD-10-CM | POA: Insufficient documentation

## 2018-01-13 DIAGNOSIS — Z6830 Body mass index (BMI) 30.0-30.9, adult: Secondary | ICD-10-CM | POA: Insufficient documentation

## 2018-01-13 DIAGNOSIS — K219 Gastro-esophageal reflux disease without esophagitis: Secondary | ICD-10-CM | POA: Insufficient documentation

## 2018-01-13 DIAGNOSIS — Z87891 Personal history of nicotine dependence: Secondary | ICD-10-CM | POA: Insufficient documentation

## 2018-01-13 LAB — HEMOCCULT GUIAC POC 1CARD (OFFICE): Fecal Occult Blood, POC: POSITIVE — AB

## 2018-01-13 MED ORDER — HYDROCORTISONE 2.5 % RE CREA: TOPICAL_CREAM | RECTAL | 0 refills | Status: DC

## 2018-01-13 MED ORDER — LEVOTHYROXINE SODIUM 125 MCG PO TABS: 125.0000 ug | ORAL_TABLET | Freq: Every day | ORAL | 6 refills | Status: DC

## 2018-01-13 MED ORDER — OMEPRAZOLE 20 MG PO CPDR: 20.0000 mg | DELAYED_RELEASE_CAPSULE | Freq: Every day | ORAL | 3 refills | Status: DC | PRN

## 2018-01-13 MED FILL — LEVOTHYROXINE 125 MCG TAB: 125 | 30 days supply | Qty: 30 | Fill #0

## 2018-01-13 MED FILL — OMEPRAZOLE 20 MG CAP: 20 | 30 days supply | Qty: 30 | Fill #0

## 2018-01-13 MED FILL — PROCTOZONE-HC 2.5 % CREA: 2.5 | 15 days supply | Qty: 30 | Fill #0

## 2018-01-13 NOTE — Patient Instructions (Signed)

## 2018-01-13 NOTE — Progress Notes (Signed)
Patient ID: Daniel Fuller, male    DOB: 10/03/1975  MRN: [id number]  CC: Follow-up (Thyroid)   Subjective: [name] is a 42 y.o. male who presents for chronic ds management His concerns today include:  Hx of GERD, H.pylori gastritis and hypothyroidism  Hypothyroid: Patient is requesting refill on levothyroxine.  Reports compliance with medication.  Denies any fatigue or palpitations.  Positive Depression:  Feels depressed some times.  He attributes this to being worried about his health. Worries about abdominal pain  - burning that occurs when he eats spicy foods and drinks juices.  He also has burning and itchy in rectal area when he has BM.  Notices a few streaks of blood on toilet paper only if he rubs the rectum too hard from the itching.  No blood in the stools.  Not sure of any hx of hemorrhoids.  No recent travel outside of the [place]. Reports having had nl EGD last yr through [facility].    Weight is up 17 pounds since the beginning of this year. Patient reports that he is not very active outside of work.  He feels he can do better with his eating habits.  HM: Due for flu shot.  Patient declines today. Patient Active Problem List   Diagnosis Date Noted  . Bacterial infection due to H. pylori 05/27/2016  . Epigastric pain 04/15/2016  . HYPOTHYROIDISM, POST-RADIOACTIVE IODINE 01/05/2007     Current Outpatient Medications on File Prior to Visit  Medication Sig Dispense Refill  . levothyroxine (SYNTHROID, LEVOTHROID) 125 MCG tablet TAKE 1 TABLET BY MOUTH DAILY. 30 tablet 2  . acetaminophen (TYLENOL) 500 MG tablet Take 2 tablets (1,000 mg total) by mouth every 8 (eight) hours as needed for mild pain or fever. (Patient not taking: Reported on 01/13/2018) 30 tablet 0  . benzonatate (TESSALON) 100 MG capsule Take 1 capsule (100 mg total) by mouth 2 (two) times daily as needed for cough. (Patient not taking: Reported on 01/13/2018) 20 capsule 0  .  diclofenac sodium (VOLTAREN) 1 % GEL Apply 2 g topically 4 (four) times daily. (Patient not taking: Reported on 06/25/2016) 100 g 2  . famotidine (PEPCID) 20 MG tablet Take 1 tablet (20 mg total) by mouth 2 (two) times daily. (Patient not taking: Reported on 01/13/2018) 60 tablet 2  . omeprazole (PRILOSEC) 20 MG capsule Take 1 capsule (20 mg total) by mouth daily. (Patient not taking: Reported on 01/13/2018) 30 capsule 3  . omeprazole (PRILOSEC) 40 MG capsule Take 1 capsule (40 mg total) by mouth daily. (Patient not taking: Reported on 01/13/2018) 30 capsule 0  . pseudoephedrine (SUDAFED) 60 MG tablet Take 1 tablet (60 mg total) by mouth every 8 (eight) hours as needed for congestion. (Patient not taking: Reported on 01/13/2018) 15 tablet 0   No current facility-administered medications on file prior to visit.     No Known Allergies  Social History   Socioeconomic History  . Marital status: Married    Spouse name: Not on file  . Number of children: Not on file  . Years of education: Not on file  . Highest education level: Not on file  Occupational History  . Not on file  Social Needs  . Financial resource strain: Not on file  . Food insecurity:    Worry: Not on file    Inability: Not on file  . Transportation needs:    Medical: Not on file    Non-medical: Not on file  Tobacco Use  .  Smoking status: Former Smoker    Packs/day: 0.40    Years: 6.00    Pack years: 2.40    Types: Cigarettes    Start date: 08/06/1994    Last attempt to quit: 08/05/2000    Years since quitting: 17.4  . Smokeless tobacco: Current User  Substance and Sexual Activity  . Alcohol use: Yes  . Drug use: No  . Sexual activity: Yes  Lifestyle  . Physical activity:    Days per week: Not on file    Minutes per session: Not on file  . Stress: Not on file  Relationships  . Social connections:    Talks on phone: Not on file    Gets together: Not on file    Attends religious service: Not on file    Active  member of club or organization: Not on file    Attends meetings of clubs or organizations: Not on file    Relationship status: Not on file  . Intimate partner violence:    Fear of current or ex partner: Not on file    Emotionally abused: Not on file    Physically abused: Not on file    Forced sexual activity: Not on file  Other Topics Concern  . Not on file  Social History Narrative  . Not on file    No family history on file.  No past surgical history on file.  ROS: Review of Systems Negative except as above. PHYSICAL EXAM: BP 127/73   Pulse 62   Temp 99.2 F (37.3 C) (Oral)   Resp 16   Wt 210 lb (95.3 kg)   SpO2 96%   BMI 31.93 kg/m   Wt Readings from Last 3 Encounters:  01/13/18 210 lb (95.3 kg)  02/11/17 193 lb (87.5 kg)  12/20/16 197 lb (89.4 kg)    Physical Exam  General appearance - alert, well appearing, and in no distress Mental status - normal mood, behavior, speech, dress, motor activity, and thought processes Chest - clear to auscultation, no wheezes, rales or rhonchi, symmetric air entry Heart - normal rate, regular rhythm, normal S1, S2, no murmurs, rubs, clicks or gallops Abdomen - soft, nontender, nondistended, no masses or organomegaly Rectal -small amount of brown soft stools noted around the rectum.  No external hemorrhoids noted.  No rectal tears appreciated.  No palpable masses felt in the rectal vault.  Stools are soft and brown.  Hemoccult was faintly positive Extremities - peripheral pulses normal, no pedal edema, no clubbing or cyanosis  Depression screen [facility] 01/13/2018 12/20/2016 06/25/2016  Decreased Interest 1 1 1   Down, Depressed, Hopeless 1 0 0  PHQ - 2 Score 2 1 1   Altered sleeping 1 1 1   Tired, decreased energy 2 0 1  Change in appetite 2 1 1   Feeling bad or failure about yourself  0 0 0  Trouble concentrating 1 1 1   Moving slowly or fidgety/restless 0 1 0  Suicidal thoughts 0 0 0  PHQ-9 Score 8 5 5     ASSESSMENT AND  PLAN: 1. HYPOTHYROIDISM, POST-RADIOACTIVE IODINE - levothyroxine (SYNTHROID, LEVOTHROID) 125 MCG tablet; Take 1 tablet (125 mcg total) by mouth daily.  Dispense: 30 tablet; Refill: 6 - TSH  2. Gastroesophageal reflux disease without esophagitis GERD precautions discussed.  Advised to avoid foods that can cause flareup of GERD including spicy foods, foods that have tomato paste or tomato sauce in them, juices.  Restart omeprazole to use as needed. - omeprazole (PRILOSEC) 20 MG  capsule; Take 1 capsule (20 mg total) by mouth daily as needed.  Dispense: 30 capsule; Refill: 3  3. Rectal itching Rectal itching and burning may be due to internal hemorrhoids. - POCT occult blood stool - hydrocortisone (ANUSOL-HC) 2.5 % rectal cream; Apply to rectal area BID PRN itching/burning  Dispense: 30 g; Refill: 0  4. Positive depression screening Patient reports that contributing factor is that he is worried about his abdominal symptoms.  Hopefully with our starting address these today his depression score will be better on follow-up visit  5. Obesity (BMI 30-39.9) Dietary counseling given along with printed material. Encourage patient to be more active.  Encouraged him to get in some form of regular aerobic activity at least 3 to 4 days a week for 30 to 40 minutes. - Hemoglobin A1c - CBC  6. Influenza vaccination declined   Patient was given the opportunity to ask questions.  Patient verbalized understanding of the plan and was able to repeat key elements of the plan.   No orders of the defined types were placed in this encounter.    Requested Prescriptions    No prescriptions requested or ordered in this encounter    No follow-ups on file.  Jonah Blueeborah Johnson, MD, FACP

## 2018-01-14 LAB — CBC
Hematocrit: 43.3 % (ref 37.5–51.0)
Hemoglobin: 14.8 g/dL (ref 13.0–17.7)
MCH: 28.8 pg (ref 26.6–33.0)
MCHC: 34.2 g/dL (ref 31.5–35.7)
MCV: 84 fL (ref 79–97)
Platelets: 213 10*3/uL (ref 150–450)
RBC: 5.13 x10E6/uL (ref 4.14–5.80)
RDW: 12.6 % (ref 12.3–15.4)
WBC: 5.7 10*3/uL (ref 3.4–10.8)

## 2018-01-14 LAB — TSH: TSH: 2.16 u[IU]/mL (ref 0.450–4.500)

## 2018-01-14 LAB — HEMOGLOBIN A1C
ESTIMATED AVERAGE GLUCOSE: 108 mg/dL
Hgb A1c MFr Bld: 5.4 % (ref 4.8–5.6)

## 2018-01-15 ENCOUNTER — Telehealth: Payer: Self-pay

## 2018-01-15 NOTE — Telephone Encounter (Signed)
Contacted pt to go over lab results pt is aware and doesn't have any questions or concerns 

## 2018-02-11 MED FILL — LEVOTHYROXINE 125 MCG TAB: 125 | 30 days supply | Qty: 30 | Fill #1

## 2018-03-11 MED FILL — LEVOTHYROXINE 125 MCG TAB: 125 | 30 days supply | Qty: 30 | Fill #2

## 2018-03-11 MED FILL — OMEPRAZOLE 20 MG CAP: 20 | 30 days supply | Qty: 30 | Fill #1

## 2018-04-14 MED FILL — LEVOTHYROXINE 125 MCG TAB: 125 | 30 days supply | Qty: 30 | Fill #3

## 2018-04-14 MED FILL — OMEPRAZOLE 20 MG CAP: 20 | 30 days supply | Qty: 30 | Fill #2

## 2018-05-02 IMAGING — CT CT ABD-PELV W/ CM
1 of 2 series · 14 of 32 positions shown, 19 images · IV contrast (APPLIED)
Comparison: None.

CLINICAL DATA: Mid abdominal pain and left-sided back pain,
chronic, with recent worsening.

EXAM:
CT ABDOMEN AND PELVIS WITH CONTRAST
TECHNIQUE: Multidetector CT imaging of the abdomen and pelvis was performed
using the standard protocol following bolus administration of
intravenous contrast.
CONTRAST:  100mL DBD85J-PHH IOPAMIDOL (DBD85J-PHH) INJECTION 61%

[Series 2: abd/pelvis w/cm · axial · 0.85mm/px · z∈[-592,-157]mm · 14 of 99 slices shown, 19 images]
[im 6/99  soft-tissue]
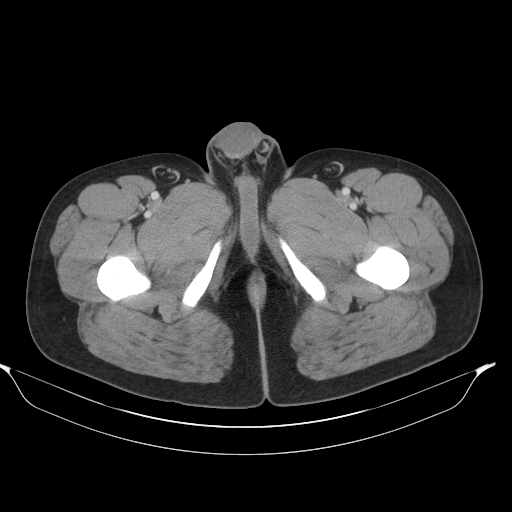
[im 6/99  bone]
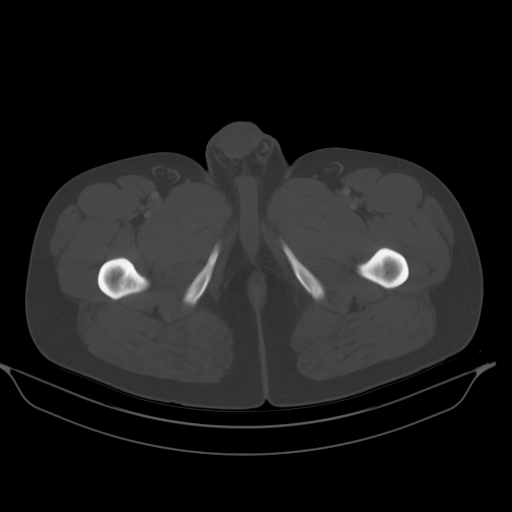
[im 11/99  soft-tissue]
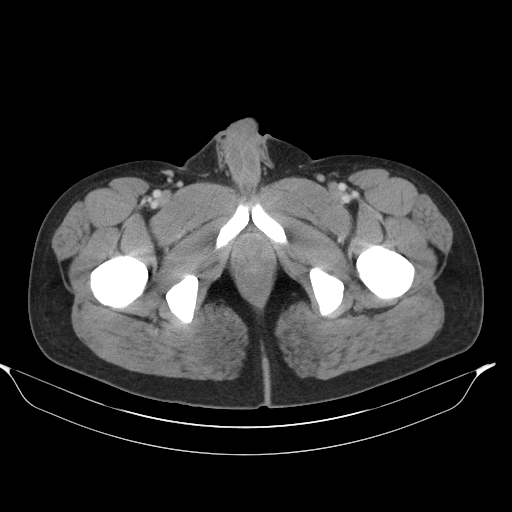
[im 22/99  soft-tissue]
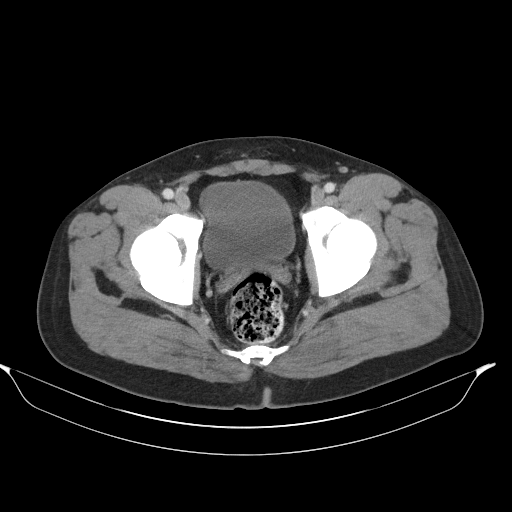
[im 28/99  soft-tissue]
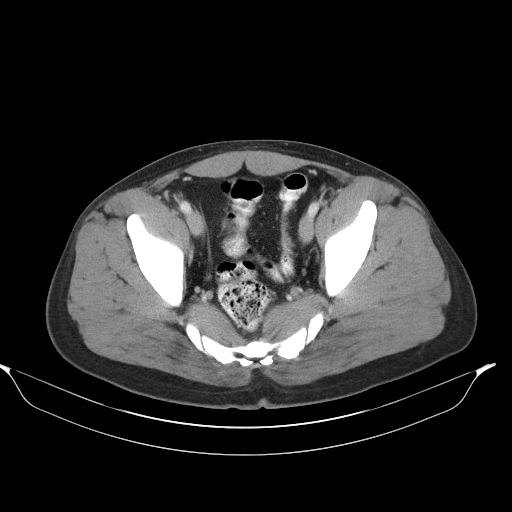
[im 33/99  soft-tissue]
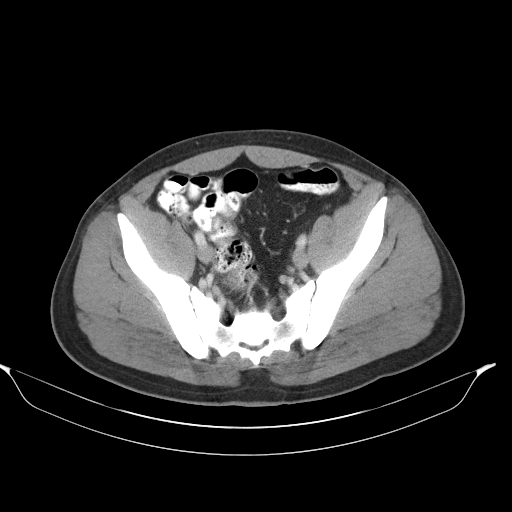
[im 44/99  soft-tissue]
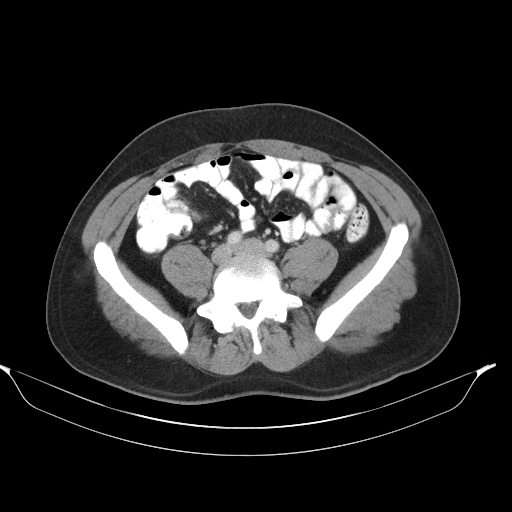
[im 50/99  soft-tissue]
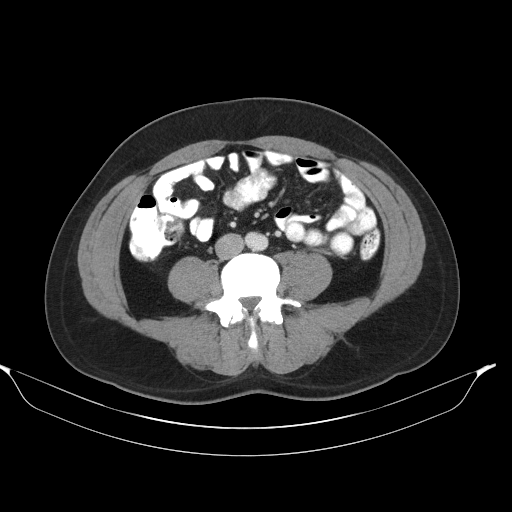
[im 55/99  soft-tissue]
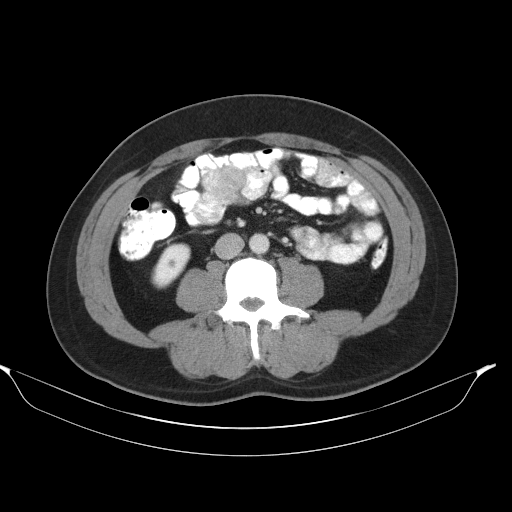
[im 66/99  soft-tissue]
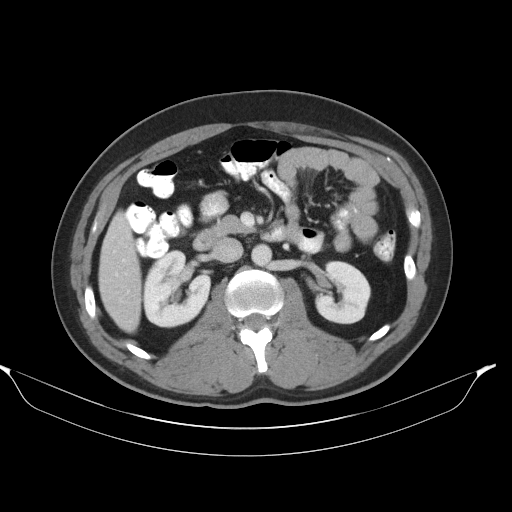
[im 66/99  bone]
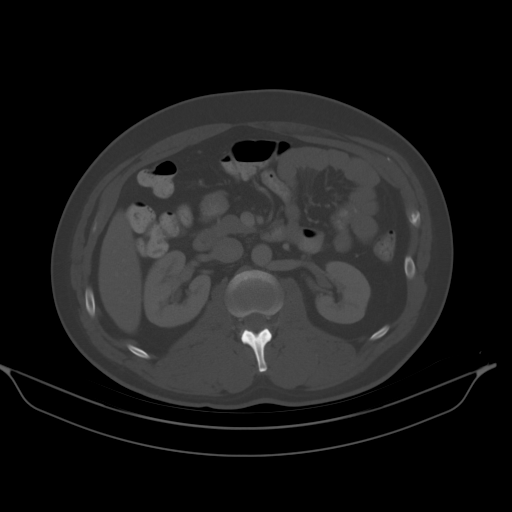
[im 71/99  soft-tissue]
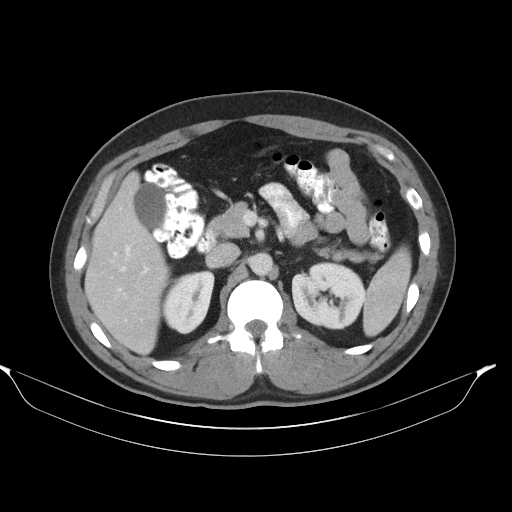
[im 77/99  soft-tissue]
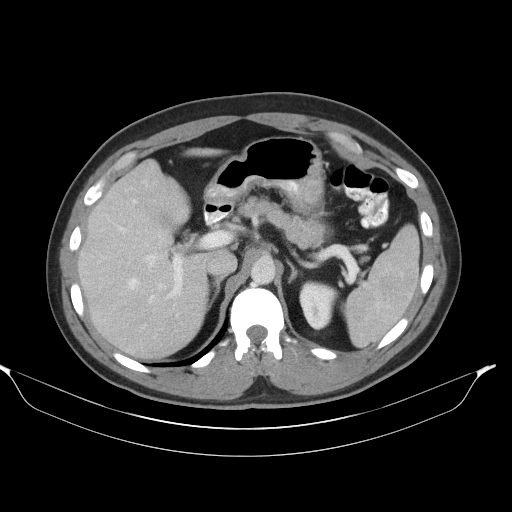
[im 77/99  lung]
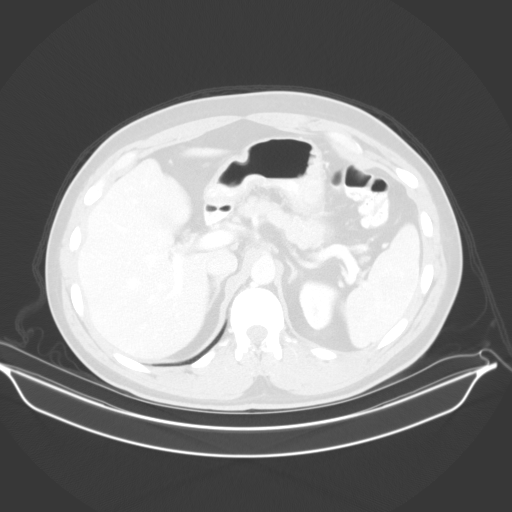
[im 82/99  lung]
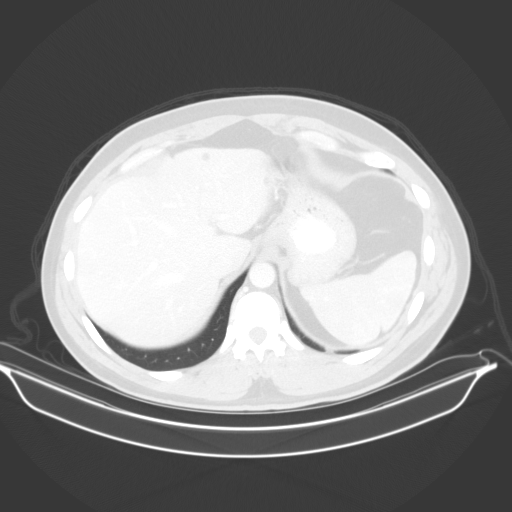
[im 88/99  soft-tissue]
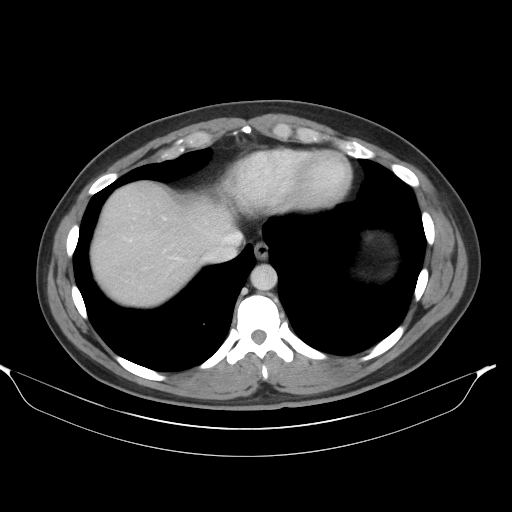
[im 88/99  lung]
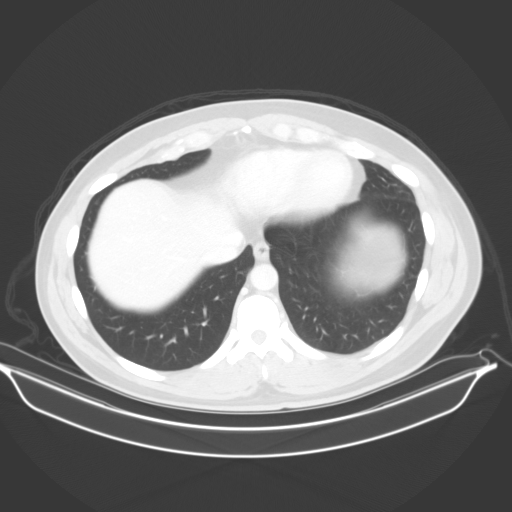
[im 93/99  soft-tissue]
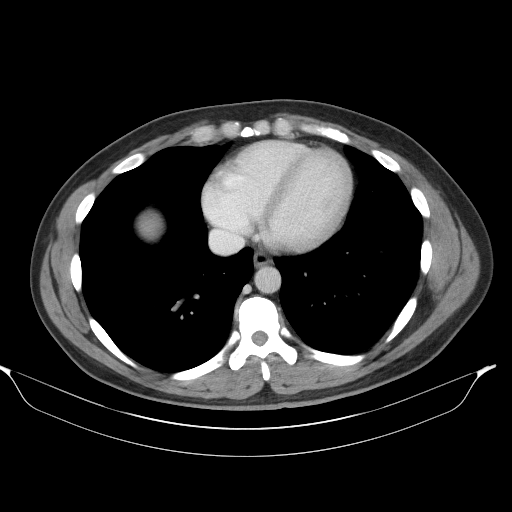
[im 93/99  lung]
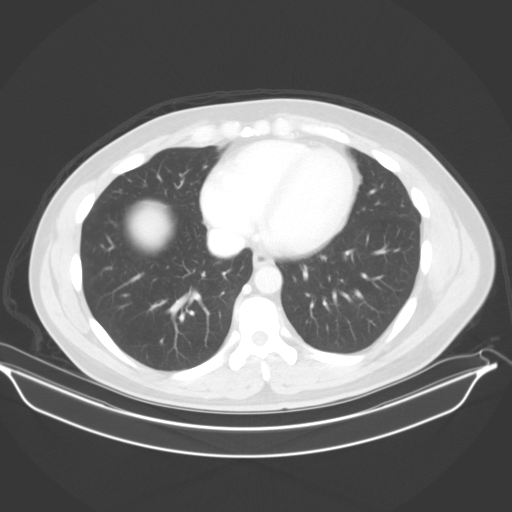

[14 of 32 positions shown; findings below may reference images not displayed]

FINDINGS: Lower chest: Negative.

Hepatobiliary: Subcentimeter low-attenuation lesions in the left
hepatic lobe are too small to characterize but cysts are likely.
Liver and gallbladder are otherwise unremarkable. No biliary ductal
dilatation.

Pancreas: Negative.

Spleen: Negative.

Adrenals/Urinary Tract: Subcentimeter low-attenuation lesions in the
kidneys are too small to characterize but cysts are most likely.
Scarring in the lower pole left kidney. Ureters are decompressed.
Bladder is grossly unremarkable.

Stomach/Bowel: Stomach, small bowel, appendix and colon are
unremarkable.

Vascular/Lymphatic: Retroaortic left renal vein. Vascular structures
are otherwise unremarkable. Scattered lymph nodes are not enlarged
by CT size criteria.

Reproductive: Prostate is visualized.

Other: No free fluid.  Mesenteries and peritoneum are unremarkable.

Musculoskeletal: Mild facet degenerative changes L4-5.
IMPRESSION: No findings to explain the patient's abdominal pain.

## 2018-05-11 MED FILL — LEVOTHYROXINE 125 MCG TAB: 125 | 30 days supply | Qty: 30 | Fill #4

## 2018-05-11 MED FILL — OMEPRAZOLE 20 MG CAP: 20 | 30 days supply | Qty: 30 | Fill #3

## 2018-06-10 MED FILL — LEVOTHYROXINE 125 MCG TAB: 125 | 30 days supply | Qty: 30 | Fill #5

## 2018-07-09 MED FILL — LEVOTHYROXINE 125 MCG TAB: 125 | 30 days supply | Qty: 30 | Fill #6

## 2018-08-14 ENCOUNTER — Other Ambulatory Visit: Payer: Self-pay

## 2018-08-14 ENCOUNTER — Ambulatory Visit: Payer: Self-pay | Admitting: Internal Medicine

## 2018-08-14 ENCOUNTER — Ambulatory Visit: Payer: Self-pay | Attending: Internal Medicine | Admitting: Internal Medicine

## 2018-08-14 ENCOUNTER — Encounter: Payer: Self-pay | Admitting: Internal Medicine

## 2018-08-14 DIAGNOSIS — E018 Other iodine-deficiency related thyroid disorders and allied conditions: Secondary | ICD-10-CM

## 2018-08-14 DIAGNOSIS — R21 Rash and other nonspecific skin eruption: Secondary | ICD-10-CM

## 2018-08-14 DIAGNOSIS — K219 Gastro-esophageal reflux disease without esophagitis: Secondary | ICD-10-CM

## 2018-08-14 MED ORDER — LEVOTHYROXINE SODIUM 125 MCG PO TABS: 125.0000 ug | ORAL_TABLET | Freq: Every day | ORAL | 6 refills | Status: DC

## 2018-08-14 MED ORDER — MICONAZOLE NITRATE 2 % EX CREA: TOPICAL_CREAM | CUTANEOUS | 0 refills | Status: DC

## 2018-08-14 MED ORDER — ESOMEPRAZOLE MAGNESIUM 20 MG PO CPDR: 20.0000 mg | DELAYED_RELEASE_CAPSULE | Freq: Every day | ORAL | 5 refills | Status: DC

## 2018-08-14 MED FILL — LEVOTHYROXINE 125 MCG TAB: 125 | 30 days supply | Qty: 30 | Fill #0

## 2018-08-14 MED FILL — ESOMEPRAZOLE MAG DR 20 MG C: 20 | 30 days supply | Qty: 30 | Fill #0

## 2018-08-14 NOTE — Progress Notes (Signed)
Pt states he is needing a cream for itching

## 2018-08-14 NOTE — Progress Notes (Signed)
Virtual Visit via Telephone Note Due to current restrictions/limitations of in-office visits due to the COVID-19 pandemic, this scheduled clinical appointment was converted to a telehealth visit  I connected with Daniel Fuller on 08/14/18 at 11:33 a.m EDT by telephone and verified that I am speaking with the correct person using two identifiers. I am in my office.  The patient is at home.  Only the patient and myself participated in this encounter.  I discussed the limitations, risks, security and privacy concerns of performing an evaluation and management service by telephone and the availability of in person appointments. I also discussed with the patient that there may be a patient responsible charge related to this service. The patient expressed understanding and agreed to proceed.  History of Present Illness: Hx of GERD, H.pylori gastritis, pos dep screen and hypothyroidism.  Last seen 12/2017  GERD:  Still gets burning and pain in stomach with certain foods.  He discontinued omeprazole because it caused headache when he takes it.  C/o itching and rash on upper inner thigh on both sides times several weeks.  Reports similar episode in the past and was treated successfully with a cream but he does not recall the name.   Thyroid:  Compliant with med. Thinks he has gained a little wgh.  No palpitations  Outpatient Encounter Medications as of 08/14/2018  Medication Sig  . esomeprazole (NEXIUM) 20 MG capsule Take 1 capsule (20 mg total) by mouth daily at 12 noon. 1 tab PO daily  . hydrocortisone (ANUSOL-HC) 2.5 % rectal cream Apply to rectal area BID PRN itching/burning  . levothyroxine (SYNTHROID) 125 MCG tablet Take 1 tablet (125 mcg total) by mouth daily.  . miconazole (MICOTIN) 2 % cream Apply to affected area BID  . omeprazole (PRILOSEC) 20 MG capsule Take 1 capsule (20 mg total) by mouth daily as needed.  . [DISCONTINUED] levothyroxine (SYNTHROID, LEVOTHROID) 125 MCG tablet  Take 1 tablet (125 mcg total) by mouth daily.   No facility-administered encounter medications on file as of 08/14/2018.     Observations/Objective: No direct observation done as this was a telephone encounter  Assessment and Plan: 1. Gastroesophageal reflux disease without esophagitis Change omeprazole to Nexium to see if he tolerates it better.  GERD precautions discussed and encouraged again. - esomeprazole (NEXIUM) 20 MG capsule; Take 1 capsule (20 mg total) by mouth daily at 12 noon. 1 tab PO daily  Dispense: 30 capsule; Refill: 5  2. HYPOTHYROIDISM, POST-RADIOACTIVE IODINE - levothyroxine (SYNTHROID) 125 MCG tablet; Take 1 tablet (125 mcg total) by mouth daily.  Dispense: 30 tablet; Refill: 6 - TSH; Future  3. Rash of groin Likely tinea skin infection. - miconazole (MICOTIN) 2 % cream; Apply to affected area BID  Dispense: 28.35 g; Refill: 0   Follow Up Instructions: Follow-up in 6 months   I discussed the assessment and treatment plan with the patient. The patient was provided an opportunity to ask questions and all were answered. The patient agreed with the plan and demonstrated an understanding of the instructions.   The patient was advised to call back or seek an in-person evaluation if the symptoms worsen or if the condition fails to improve as anticipated.  I provided 12 minutes of non-face-to-face time during this encounter.   Karle Plumber, MD

## 2018-08-17 ENCOUNTER — Telehealth: Payer: Self-pay | Admitting: Internal Medicine

## 2018-08-17 NOTE — Telephone Encounter (Signed)
-----   Message from Jackelyn Knife, [place] sent at 08/14/2018 11:44 AM EDT ----- Please contact pt and schedule a 6 month f/u

## 2018-08-17 NOTE — Telephone Encounter (Signed)
Attempted to reach patient no answer LVM

## 2018-08-18 ENCOUNTER — Ambulatory Visit: Payer: Self-pay | Attending: Internal Medicine

## 2018-08-18 ENCOUNTER — Other Ambulatory Visit: Payer: Self-pay

## 2018-08-18 DIAGNOSIS — E018 Other iodine-deficiency related thyroid disorders and allied conditions: Secondary | ICD-10-CM

## 2018-08-19 LAB — TSH: TSH: 2.05 u[IU]/mL (ref 0.450–4.500)

## 2018-08-24 ENCOUNTER — Telehealth: Payer: Self-pay

## 2018-08-24 NOTE — Telephone Encounter (Signed)
Contacted pt to go over lab results pt didn't answer left a detailed vm informing pt of results and if he has any questions or concerns to give me a call  

## 2018-09-09 MED FILL — LEVOTHYROXINE 125 MCG TAB: 125 | 30 days supply | Qty: 30 | Fill #1

## 2018-10-12 MED FILL — LEVOTHYROXINE 125 MCG TAB: 125 | 30 days supply | Qty: 30 | Fill #2

## 2018-11-09 MED FILL — LEVOTHYROXINE 125 MCG TAB: 125 | 30 days supply | Qty: 30 | Fill #2

## 2019-01-11 MED FILL — LEVOTHYROXINE 125 MCG TAB: 125 | 30 days supply | Qty: 30 | Fill #4

## 2019-02-08 MED FILL — LEVOTHYROXINE 125 MCG TAB: 125 | 30 days supply | Qty: 30 | Fill #5

## 2019-03-08 MED FILL — LEVOTHYROXINE 125 MCG TAB: 125 | 30 days supply | Qty: 30 | Fill #6

## 2019-03-24 ENCOUNTER — Other Ambulatory Visit: Payer: Self-pay

## 2019-03-24 ENCOUNTER — Ambulatory Visit: Payer: Self-pay | Attending: Internal Medicine | Admitting: Physician Assistant

## 2019-03-24 DIAGNOSIS — Z13220 Encounter for screening for lipoid disorders: Secondary | ICD-10-CM

## 2019-03-24 DIAGNOSIS — E018 Other iodine-deficiency related thyroid disorders and allied conditions: Secondary | ICD-10-CM

## 2019-03-24 DIAGNOSIS — Z131 Encounter for screening for diabetes mellitus: Secondary | ICD-10-CM

## 2019-03-24 MED ORDER — LEVOTHYROXINE SODIUM 125 MCG PO TABS: 125.0000 ug | ORAL_TABLET | Freq: Every day | ORAL | 6 refills | Status: DC

## 2019-03-24 NOTE — Progress Notes (Signed)
Virtual Visit via Telephone Note  I connected with Daniel Fuller on 03/24/19 at  4:10 PM EST by telephone and verified that I am speaking with the correct person using two identifiers.   I discussed the limitations, risks, security and privacy concerns of performing an evaluation and management service by telephone and the availability of in person appointments. I also discussed with the patient that there may be a patient responsible charge related to this service. The patient expressed understanding and agreed to proceed.  PATIENT visit by telephone virtually in the context of Covid-19 pandemic. Patient location:  home My Location:  [facility] office Persons on the call:  Me and the patient.     History of Present Illness:  Patient is needing RF of thyroid meds.  Last labs about 6 months ago were stable.  No concerns issues or complaints.  He still has a few days of meds left and is willing to come and get labs done when he picks up his RF.    Observations/Objective:  NAD.  A&Ox3   Assessment and Plan: 1. HYPOTHYROIDISM, POST-RADIOACTIVE IODINE - levothyroxine (SYNTHROID) 125 MCG tablet; Take 1 tablet (125 mcg total) by mouth daily.  Dispense: 30 tablet; Refill: 6 - Thyroid Panel With TSH; Future - Basic metabolic panel; Future  2. Screening, lipid - Lipid panel; Future  3. Screening for diabetes mellitus I have had a lengthy discussion and provided education about insulin resistance and the intake of too much sugar/refined carbohydrates.  I have advised the patient to work at a goal of eliminating sugary drinks, candy, desserts, sweets, refined sugars, processed foods, and white carbohydrates.  The patient expresses understanding.  - Hemoglobin A1c; Future    Follow Up Instructions: See PCP in 3-4 months   I discussed the assessment and treatment plan with the patient. The patient was provided an opportunity to ask questions and all were answered. The patient agreed with  the plan and demonstrated an understanding of the instructions.   The patient was advised to call back or seek an in-person evaluation if the symptoms worsen or if the condition fails to improve as anticipated.  I provided 9 minutes of non-face-to-face time during this encounter.   Georgian Co, PA-C  Patient ID: Daniel Fuller, male   DOB: 1975/10/18, 44 y.o.   MRN: [id number]

## 2019-04-08 MED FILL — LEVOTHYROXINE 125 MCG TAB: 125 | 30 days supply | Qty: 30 | Fill #0

## 2019-04-09 ENCOUNTER — Other Ambulatory Visit: Payer: Self-pay

## 2019-04-09 ENCOUNTER — Ambulatory Visit: Payer: Self-pay | Attending: Internal Medicine

## 2019-04-09 DIAGNOSIS — Z131 Encounter for screening for diabetes mellitus: Secondary | ICD-10-CM

## 2019-04-09 DIAGNOSIS — Z13220 Encounter for screening for lipoid disorders: Secondary | ICD-10-CM

## 2019-04-09 DIAGNOSIS — E018 Other iodine-deficiency related thyroid disorders and allied conditions: Secondary | ICD-10-CM

## 2019-04-10 LAB — LIPID PANEL
Chol/HDL Ratio: 4.4 ratio (ref 0.0–5.0)
Cholesterol, Total: 195 mg/dL (ref 100–199)
HDL: 44 mg/dL (ref >39)
LDL Chol Calc (NIH): 131 mg/dL — ABNORMAL HIGH (ref 0–99)
Triglycerides: 112 mg/dL (ref 0–149)
VLDL Cholesterol Cal: 20 mg/dL (ref 5–40)

## 2019-04-10 LAB — THYROID PANEL WITH TSH
Free Thyroxine Index: 2.7 (ref 1.2–4.9)
T3 Uptake Ratio: 28 % (ref 24–39)
T4, Total: 9.7 ug/dL (ref 4.5–12.0)
TSH: 2.69 u[IU]/mL (ref 0.450–4.500)

## 2019-04-10 LAB — BASIC METABOLIC PANEL
BUN/Creatinine Ratio: 22 — ABNORMAL HIGH (ref 9–20)
BUN: 19 mg/dL (ref 6–24)
CO2: 24 mmol/L (ref 20–29)
Calcium: 9.2 mg/dL (ref 8.7–10.2)
Chloride: 99 mmol/L (ref 96–106)
Creatinine, Ser: 0.87 mg/dL (ref 0.76–1.27)
GFR calc Af Amer: 123 mL/min/{1.73_m2} (ref >59)
GFR calc non Af Amer: 106 mL/min/{1.73_m2} (ref >59)
Glucose: 85 mg/dL (ref 65–99)
Potassium: 4.6 mmol/L (ref 3.5–5.2)
Sodium: 137 mmol/L (ref 134–144)

## 2019-04-10 LAB — HEMOGLOBIN A1C
Est. average glucose Bld gHb Est-mCnc: 108 mg/dL
Hgb A1c MFr Bld: 5.4 % (ref 4.8–5.6)

## 2019-05-10 MED FILL — LEVOTHYROXINE 125 MCG TAB: 125 | 30 days supply | Qty: 30 | Fill #1

## 2019-06-03 ENCOUNTER — Telehealth: Payer: Self-pay

## 2019-06-03 NOTE — Telephone Encounter (Signed)
Why is pt needing a referral to Cardiology

## 2019-06-03 NOTE — Telephone Encounter (Signed)
Pt states he was to have a referral to cardiology placed. Pt request referral. PT PH [phone]. Pt has appt PCP 07/08/19.

## 2019-06-03 NOTE — Telephone Encounter (Signed)
Pt states PCP said she wanted pt to see cardiologist at last visit but pt did not want to at that time.  I see no documentation in the last office visit note although pt said it was discussed.

## 2019-06-04 NOTE — Telephone Encounter (Signed)
Will forward to pcp

## 2019-06-04 NOTE — Telephone Encounter (Signed)
Please contact pt and schedule an appointment.

## 2019-06-07 MED FILL — LEVOTHYROXINE 125 MCG TAB: 125 | 30 days supply | Qty: 30 | Fill #2

## 2019-06-09 NOTE — Telephone Encounter (Signed)
Called pt to schedule. Pt states he is really busy & not sure when he will have time to make another appt. Pt will call back when ready.

## 2019-06-16 ENCOUNTER — Encounter: Payer: Self-pay | Admitting: Internal Medicine

## 2019-06-16 NOTE — Progress Notes (Signed)
Received a note from [facility] Dr. Levora Angel who saw this patient on 05/27/2019 for left upper quadrant pain.  Pain was assessed to be musculoskeletal.  Patient was prescribed tizanidine to use as needed.  For chronic GERD, he was started on pantoprazole.  For chronic constipation he was told to try MiraLAX.  For anal itch, he was started on diltiazem 2% plus lidocaine 2% cream mixed to apply to the rectal area 3 times a day as needed.  His CBC was normal with hemoglobin of 14/hematocrit 42.  Chemistry was normal.  Lipase normal.  C-reactive protein of 4.

## 2019-07-06 MED FILL — LEVOTHYROXINE 125 MCG TAB: 125 | 30 days supply | Qty: 30 | Fill #3

## 2019-07-08 ENCOUNTER — Ambulatory Visit: Payer: Self-pay | Admitting: Internal Medicine

## 2019-08-05 MED FILL — LEVOTHYROXINE 125 MCG TAB: 125 | 30 days supply | Qty: 30 | Fill #4

## 2019-09-07 MED FILL — LEVOTHYROXINE 125 MCG TAB: 125 | 30 days supply | Qty: 30 | Fill #5

## 2019-10-05 MED FILL — LEVOTHYROXINE 125 MCG TAB: 125 | 30 days supply | Qty: 30 | Fill #6

## 2019-11-01 ENCOUNTER — Ambulatory Visit (HOSPITAL_COMMUNITY)
Admission: EM | Admit: 2019-11-01 | Discharge: 2019-11-01 | Disposition: A | Discharge status: Home / Self Care | Payer: HRSA Program | Attending: Urgent Care | Admitting: Urgent Care

## 2019-11-01 ENCOUNTER — Encounter (HOSPITAL_COMMUNITY): Payer: Self-pay

## 2019-11-01 DIAGNOSIS — U071 COVID-19: Secondary | ICD-10-CM | POA: Insufficient documentation

## 2019-11-01 DIAGNOSIS — R0981 Nasal congestion: Secondary | ICD-10-CM | POA: Diagnosis present

## 2019-11-01 DIAGNOSIS — R509 Fever, unspecified: Secondary | ICD-10-CM

## 2019-11-01 DIAGNOSIS — R52 Pain, unspecified: Secondary | ICD-10-CM

## 2019-11-01 DIAGNOSIS — M25552 Pain in left hip: Secondary | ICD-10-CM | POA: Diagnosis not present

## 2019-11-01 DIAGNOSIS — J069 Acute upper respiratory infection, unspecified: Secondary | ICD-10-CM

## 2019-11-01 LAB — POCT RAPID STREP A, ED / UC: Streptococcus, Group A Screen (Direct): NEGATIVE

## 2019-11-01 MED ORDER — BENZONATATE 100 MG PO CAPS: 100.0000 mg | ORAL_CAPSULE | Freq: Three times a day (TID) | ORAL | 0 refills | Status: DC | PRN

## 2019-11-01 MED ORDER — CETIRIZINE HCL 10 MG PO TABS: 10.0000 mg | ORAL_TABLET | Freq: Every day | ORAL | 0 refills | Status: DC

## 2019-11-01 MED ORDER — PROMETHAZINE-DM 6.25-15 MG/5ML PO SYRP: 5.0000 mL | ORAL_SOLUTION | Freq: Every evening | ORAL | 0 refills | Status: DC | PRN

## 2019-11-01 MED ORDER — PSEUDOEPHEDRINE HCL 60 MG PO TABS: 60.0000 mg | ORAL_TABLET | Freq: Three times a day (TID) | ORAL | 0 refills | Status: DC | PRN

## 2019-11-01 NOTE — ED Triage Notes (Signed)
Pt reports fever, nasal congestion, nausea and body aches, sore throat and loss of taste x 4 days. Advil gives some relief.

## 2019-11-01 NOTE — ED Provider Notes (Signed)
[facility]   MRN: [id number] DOB: [birth year]  Subjective:   Daniel Fuller is a 44 y.o. male presenting for 4-day history of cute onset malaise including sinus congestion, throat pain, scratchy throat, occasional cough, body aches, left hip pain.  Patient has also felt significantly fatigued.  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  Has not had his Covid vaccination.  Denies chest pain, shortness of breath.  Has used Advil with some relief.  Takes levothyroxine only.  Allergies  Allergen Reactions  . Omeprazole     headache    Past Medical History:  Diagnosis Date  . Thyroid disease      No past surgical history on file.  No family history on file.  Social History   Tobacco Use  . Smoking status: Former Smoker    Packs/day: 0.40    Years: 6.00    Pack years: 2.40    Types: Cigarettes    Start date: 08/06/1994    Quit date: 08/05/2000    Years since quitting: 19.2  . Smokeless tobacco: Current User  Substance Use Topics  . Alcohol use: Yes  . Drug use: No    ROS   Objective:   Vitals: BP 131/69 (BP Location: Right Arm)   Pulse 65   Temp 100.3 F (37.9 C) (Oral)   Resp 18   SpO2 97%   Physical Exam Constitutional:      General: He is not in acute distress.    Appearance: Normal appearance. He is well-developed. He is not ill-appearing, toxic-appearing or diaphoretic.  HENT:     Head: Normocephalic and atraumatic.     Right Ear: External ear normal.     Left Ear: External ear normal.     Nose: Nose normal.     Mouth/Throat:     Mouth: Mucous membranes are moist.     Pharynx: Oropharynx is clear.  Eyes:     General: No scleral icterus.    Extraocular Movements: Extraocular movements intact.     Pupils: Pupils are equal, round, and reactive to light.  Cardiovascular:     Rate and Rhythm: Normal rate and regular rhythm.     Heart sounds: Normal heart sounds. No murmur heard.  No friction rub. No gallop.   Pulmonary:      Effort: Pulmonary effort is normal. No respiratory distress.     Breath sounds: Normal breath sounds. No stridor. No wheezing, rhonchi or rales.  Neurological:     Mental Status: He is alert and oriented to person, place, and time.  Psychiatric:        Mood and Affect: Mood normal.        Behavior: Behavior normal.        Thought Content: Thought content normal.     Assessment and Plan :   PDMP not reviewed this encounter.  1. Viral URI with cough   2. Nasal congestion   3. Body aches   4. Left hip pain     Will manage for viral illness such as viral URI, viral syndrome, viral rhinitis, COVID-19. Counseled patient on nature of COVID-19 including modes of transmission, diagnostic testing, management and supportive care.  Offered scripts for symptomatic relief. COVID 19 testing is pending.  Suspect hip pain is musculoskeletal in nature, likely related to the nature of his work and his current illness.  Recommended conservative management including rest, Tylenol.  Counseled patient on potential for adverse effects with medications prescribed/recommended today, ER and return-to-clinic precautions  discussed, patient verbalized understanding.     Wallis Bamberg, [place] 11/01/19 [phone]

## 2019-11-01 NOTE — Discharge Instructions (Addendum)
Para el dolor de garganta o tos puede usar un t de miel. Use 3 cucharaditas de miel con jugo exprimido de [employer]. Coloque trozos de Microbiologist en 1/2-1 taza de agua y caliente sobre la estufa. Luego mezcle los ingredientes y repita cada 4 horas. Para fiebre, dolores de cuerpo tome Tylenol 500mg -650mg  cada 6 horas. Hidrata muy bien con al menos 2 litros (64 onzas) de agua al dia. Coma comidas ligeras como sopas para y nutricion. Tambien puede tomar suero. Comience un antihistamnico como Zyrtec (cetirizina) 10mg  al dia. Puede usar pseudoefedrina (Sudafed) de venta libre para el goteo posnasal, congestin a una dosis de 60 mg cada 8 horas o cada 12 horas. Use el jarabe por la noche para su tos y las capsulas durante el dia.

## 2019-11-02 LAB — CULTURE, GROUP A STREP (THRC)

## 2019-11-02 LAB — SARS CORONAVIRUS 2 (TAT 6-24 HRS): SARS Coronavirus 2: POSITIVE — AB

## 2019-11-03 ENCOUNTER — Telehealth (HOSPITAL_COMMUNITY): Payer: Self-pay

## 2019-11-03 ENCOUNTER — Other Ambulatory Visit: Payer: Self-pay | Admitting: Physician Assistant

## 2019-11-03 DIAGNOSIS — E018 Other iodine-deficiency related thyroid disorders and allied conditions: Secondary | ICD-10-CM

## 2019-11-03 LAB — CULTURE, GROUP A STREP (THRC)

## 2019-11-03 MED FILL — LEVOTHYROXINE 125 MCG TAB: 125 | 30 days supply | Qty: 30 | Fill #0

## 2019-11-03 NOTE — Telephone Encounter (Signed)
Called to Discuss with patient about Covid symptoms and the use of the monoclonal antibody infusion for those with mild to moderate Covid symptoms and at a high risk of hospitalization.     Pt appears to qualify for this infusion due to co-morbid conditions and/or a member of an at-risk group in accordance with the FDA Emergency Use Authorization.    Unable to reach pt, left voicemail to return phone call at [phone].

## 2019-12-06 MED FILL — LEVOTHYROXINE 125 MCG TAB: 125 | 30 days supply | Qty: 30 | Fill #1

## 2020-01-03 MED FILL — LEVOTHYROXINE 125 MCG TAB: 125 | 30 days supply | Qty: 30 | Fill #2

## 2020-02-01 ENCOUNTER — Ambulatory Visit: Payer: Self-pay | Admitting: Internal Medicine

## 2020-02-03 ENCOUNTER — Telehealth: Payer: Self-pay | Admitting: Internal Medicine

## 2020-02-03 ENCOUNTER — Other Ambulatory Visit: Payer: Self-pay

## 2020-02-03 DIAGNOSIS — E018 Other iodine-deficiency related thyroid disorders and allied conditions: Secondary | ICD-10-CM

## 2020-02-03 MED ORDER — LEVOTHYROXINE SODIUM 125 MCG PO TABS: 125.0000 ug | ORAL_TABLET | Freq: Every day | ORAL | 0 refills | Status: DC

## 2020-02-03 NOTE — Telephone Encounter (Signed)
Pt is requesting to have thyroid medication levothyroxine (SYNTHROID) 125 MCG tablet   sent to the [facility] on  [address], [place], [place] [address] asap please advise and thank you

## 2020-02-03 NOTE — Telephone Encounter (Signed)
Rx has been sent  

## 2020-02-04 ENCOUNTER — Encounter: Payer: Self-pay | Admitting: Internal Medicine

## 2020-02-21 ENCOUNTER — Other Ambulatory Visit: Payer: Self-pay

## 2020-02-21 ENCOUNTER — Ambulatory Visit: Payer: Self-pay | Attending: Internal Medicine | Admitting: Internal Medicine

## 2020-02-21 ENCOUNTER — Encounter: Payer: Self-pay | Admitting: Internal Medicine

## 2020-02-21 VITALS — BP 120/72 | HR 59 | Temp 98.4°F | Resp 16 | Ht 68.0 in | Wt 210.4 lb

## 2020-02-21 DIAGNOSIS — Z2821 Immunization not carried out because of patient refusal: Secondary | ICD-10-CM

## 2020-02-21 DIAGNOSIS — Z5320 Procedure and treatment not carried out because of patient's decision for unspecified reasons: Secondary | ICD-10-CM

## 2020-02-21 DIAGNOSIS — Z0000 Encounter for general adult medical examination without abnormal findings: Secondary | ICD-10-CM | POA: Insufficient documentation

## 2020-02-21 DIAGNOSIS — R1033 Periumbilical pain: Secondary | ICD-10-CM | POA: Insufficient documentation

## 2020-02-21 DIAGNOSIS — Z760 Encounter for issue of repeat prescription: Secondary | ICD-10-CM | POA: Insufficient documentation

## 2020-02-21 DIAGNOSIS — R001 Bradycardia, unspecified: Secondary | ICD-10-CM | POA: Insufficient documentation

## 2020-02-21 DIAGNOSIS — K5900 Constipation, unspecified: Secondary | ICD-10-CM | POA: Insufficient documentation

## 2020-02-21 DIAGNOSIS — Z87891 Personal history of nicotine dependence: Secondary | ICD-10-CM | POA: Insufficient documentation

## 2020-02-21 DIAGNOSIS — Z888 Allergy status to other drugs, medicaments and biological substances status: Secondary | ICD-10-CM | POA: Insufficient documentation

## 2020-02-21 DIAGNOSIS — E039 Hypothyroidism, unspecified: Secondary | ICD-10-CM | POA: Insufficient documentation

## 2020-02-21 DIAGNOSIS — K219 Gastro-esophageal reflux disease without esophagitis: Secondary | ICD-10-CM | POA: Insufficient documentation

## 2020-02-21 DIAGNOSIS — E018 Other iodine-deficiency related thyroid disorders and allied conditions: Secondary | ICD-10-CM

## 2020-02-21 DIAGNOSIS — Z79899 Other long term (current) drug therapy: Secondary | ICD-10-CM | POA: Insufficient documentation

## 2020-02-21 DIAGNOSIS — H11001 Unspecified pterygium of right eye: Secondary | ICD-10-CM | POA: Insufficient documentation

## 2020-02-21 DIAGNOSIS — E669 Obesity, unspecified: Secondary | ICD-10-CM | POA: Insufficient documentation

## 2020-02-21 DIAGNOSIS — Z6831 Body mass index (BMI) 31.0-31.9, adult: Secondary | ICD-10-CM | POA: Insufficient documentation

## 2020-02-21 MED ORDER — LEVOTHYROXINE SODIUM 125 MCG PO TABS: 125.0000 ug | ORAL_TABLET | Freq: Every day | ORAL | 6 refills | Status: DC

## 2020-02-21 MED FILL — LEVOTHYROXINE 125 MCG TAB: 125 | 30 days supply | Qty: 30 | Fill #0

## 2020-02-21 NOTE — Progress Notes (Signed)
Pt states he has discomfort on his left side

## 2020-02-21 NOTE — Patient Instructions (Signed)
You should call and schedule an appointment with an ophthalmologist to have your eyes checked.   Healthy Eating Following a healthy eating pattern may help you to achieve and maintain a healthy body weight, reduce the risk of chronic disease, and live a long and productive life. It is important to follow a healthy eating pattern at an appropriate calorie level for your body. Your nutritional needs should be met primarily through food by choosing a variety of nutrient-rich foods. What are tips for following this plan? Reading food labels  Read labels and choose the following: ? Reduced or low sodium. ? Juices with 100% fruit juice. ? Foods with low saturated fats and high polyunsaturated and monounsaturated fats. ? Foods with whole grains, such as whole wheat, cracked wheat, brown rice, and wild rice. ? Whole grains that are fortified with folic acid. This is recommended for women who are pregnant or who want to become pregnant.  Read labels and avoid the following: ? Foods with a lot of added sugars. These include foods that contain brown sugar, corn sweetener, corn syrup, dextrose, fructose, glucose, high-fructose corn syrup, honey, invert sugar, lactose, malt syrup, maltose, molasses, raw sugar, sucrose, trehalose, or turbinado sugar.  Do not eat more than the following amounts of added sugar per day:  6 teaspoons (25 g) for women.  9 teaspoons (38 g) for men. ? Foods that contain processed or refined starches and grains. ? Refined grain products, such as white flour, degermed cornmeal, white bread, and white rice. Shopping  Choose nutrient-rich snacks, such as vegetables, whole fruits, and nuts. Avoid high-calorie and high-sugar snacks, such as potato chips, fruit snacks, and candy.  Use oil-based dressings and spreads on foods instead of solid fats such as butter, stick margarine, or cream cheese.  Limit pre-made sauces, mixes, and "instant" products such as flavored rice, instant  noodles, and ready-made pasta.  Try more plant-protein sources, such as tofu, tempeh, black beans, edamame, lentils, nuts, and seeds.  Explore eating plans such as the Mediterranean diet or vegetarian diet. Cooking  Use oil to saut or stir-fry foods instead of solid fats such as butter, stick margarine, or lard.  Try baking, boiling, grilling, or broiling instead of frying.  Remove the fatty part of meats before cooking.  Steam vegetables in water or broth. Meal planning  At meals, imagine dividing your plate into fourths: ? One-half of your plate is fruits and vegetables. ? One-fourth of your plate is whole grains. ? One-fourth of your plate is protein, especially lean meats, poultry, eggs, tofu, beans, or nuts.  Include low-fat dairy as part of your daily diet.   Lifestyle  Choose healthy options in all settings, including home, work, school, restaurants, or stores.  Prepare your food safely: ? Wash your hands after handling raw meats. ? Keep food preparation surfaces clean by regularly washing with hot, soapy water. ? Keep raw meats separate from ready-to-eat foods, such as fruits and vegetables. ? Cook seafood, meat, poultry, and eggs to the recommended internal temperature. ? Store foods at safe temperatures. In general:  Keep cold foods at 39F (4.4C) or below.  Keep hot foods at 139F (60C) or above.  Keep your freezer at [facility] (-17.8C) or below.  Foods are no longer safe to eat when they have been between the temperatures of 40-139F (4.4-60C) for more than 2 hours. What foods should I eat? Fruits Aim to eat 2 cup-equivalents of fresh, canned (in natural juice), or frozen fruits each day. Examples  of 1 cup-equivalent of fruit include 1 small apple, 8 large strawberries, 1 cup canned fruit,  cup dried fruit, or 1 cup 100% juice. Vegetables Aim to eat 2-3 cup-equivalents of fresh and frozen vegetables each day, including different varieties and colors. Examples  of 1 cup-equivalent of vegetables include 2 medium carrots, 2 cups raw, leafy greens, 1 cup chopped vegetable (raw or cooked), or 1 medium baked potato. Grains Aim to eat 6 ounce-equivalents of whole grains each day. Examples of 1 ounce-equivalent of grains include 1 slice of bread, 1 cup ready-to-eat cereal, 3 cups popcorn, or  cup cooked rice, pasta, or cereal. Meats and other proteins Aim to eat 5-6 ounce-equivalents of protein each day. Examples of 1 ounce-equivalent of protein include 1 egg, 1/2 cup nuts or seeds, or 1 tablespoon (16 g) peanut butter. A cut of meat or fish that is the size of a deck of cards is about 3-4 ounce-equivalents.  Of the protein you eat each week, try to have at least 8 ounces come from seafood. This includes salmon, trout, herring, and anchovies. Dairy Aim to eat 3 cup-equivalents of fat-free or low-fat dairy each day. Examples of 1 cup-equivalent of dairy include 1 cup (240 mL) milk, 8 ounces (250 g) yogurt, 1 ounces (44 g) natural cheese, or 1 cup (240 mL) fortified soy milk. Fats and oils  Aim for about 5 teaspoons (21 g) per day. Choose monounsaturated fats, such as canola and olive oils, avocados, peanut butter, and most nuts, or polyunsaturated fats, such as sunflower, corn, and soybean oils, walnuts, pine nuts, sesame seeds, sunflower seeds, and flaxseed. Beverages  Aim for six 8-oz glasses of water per day. Limit coffee to three to five 8-oz cups per day.  Limit caffeinated beverages that have added calories, such as soda and energy drinks.  Limit alcohol intake to no more than 1 drink a day for nonpregnant women and 2 drinks a day for men. One drink equals 12 oz of beer (355 mL), 5 oz of wine (148 mL), or 1 oz of hard liquor (44 mL). Seasoning and other foods  Avoid adding excess amounts of salt to your foods. Try flavoring foods with herbs and spices instead of salt.  Avoid adding sugar to foods.  Try using oil-based dressings, sauces, and  spreads instead of solid fats. This information is based on general [place] nutrition guidelines. For more information, visit [employer]. Exact amounts may vary based on your nutrition needs. Summary  A healthy eating plan may help you to maintain a healthy weight, reduce the risk of chronic diseases, and stay active throughout your life.  Plan your meals. Make sure you eat the right portions of a variety of nutrient-rich foods.  Try baking, boiling, grilling, or broiling instead of frying.  Choose healthy options in all settings, including home, work, school, restaurants, or stores. This information is not intended to replace advice given to you by your health care provider. Make sure you discuss any questions you have with your health care provider. Document Revised: 04/14/2017 Document Reviewed: 04/14/2017 Elsevier Patient Education  [place].

## 2020-02-21 NOTE — Progress Notes (Signed)
Patient ID: Daniel Fuller, male    DOB: 06/29/1975  MRN: [id number]  CC: Medication Refill and Annual Exam   Subjective: Daniel Fuller is a 45 y.o. male who presents for annual exam His concerns today include:  Patient with history of GERD, chronic constipation, rectal itching, hypothyroidism  Hypothyroid: Reports compliance with levothyroxine.  No major weight changes.  No palpitations.  BMI is 31.  Does a lot of walking at work and walks for 1 hr 2 x a wk.  He does landscaping Drinks tea, coffee, water.  Eats a lot of bread.  HM: Had 2 shots of the Moderna, last shot 12/2019.  Due for booster 06/2020.  Declines Tdap  Patient Active Problem List   Diagnosis Date Noted  . Influenza vaccine refused 02/21/2020  . Gastroesophageal reflux disease without esophagitis 01/13/2018  . Positive depression screening 01/13/2018  . Obesity (BMI 30-39.9) 01/13/2018  . Bacterial infection due to H. pylori 05/27/2016  . Epigastric pain 04/15/2016  . HYPOTHYROIDISM, POST-RADIOACTIVE IODINE 01/05/2007     Current Outpatient Medications on File Prior to Visit  Medication Sig Dispense Refill  . benzonatate (TESSALON) 100 MG capsule Take 1-2 capsules (100-200 mg total) by mouth 3 (three) times daily as needed. (Patient not taking: Reported on 02/21/2020) 60 capsule 0  . cetirizine (ZYRTEC ALLERGY) 10 MG tablet Take 1 tablet (10 mg total) by mouth daily. 30 tablet 0  . esomeprazole (NEXIUM) 20 MG capsule Take 1 capsule (20 mg total) by mouth daily at 12 noon. 1 tab PO daily 30 capsule 5  . hydrocortisone (ANUSOL-HC) 2.5 % rectal cream Apply to rectal area BID PRN itching/burning 30 g 0  . levothyroxine (SYNTHROID) 125 MCG tablet Take 1 tablet (125 mcg total) by mouth daily. 30 tablet 0  . miconazole (MICOTIN) 2 % cream Apply to affected area BID 28.35 g 0  . omeprazole (PRILOSEC) 20 MG capsule Take 1 capsule (20 mg total) by mouth daily as needed. 30 capsule 3  .  promethazine-dextromethorphan (PROMETHAZINE-DM) 6.25-15 MG/5ML syrup Take 5 mLs by mouth at bedtime as needed for cough. (Patient not taking: Reported on 02/21/2020) 100 mL 0  . pseudoephedrine (SUDAFED) 60 MG tablet Take 1 tablet (60 mg total) by mouth every 8 (eight) hours as needed for congestion. (Patient not taking: Reported on 02/21/2020) 30 tablet 0   No current facility-administered medications on file prior to visit.    Allergies  Allergen Reactions  . Omeprazole     headache    Social History   Socioeconomic History  . Marital status: Married    Spouse name: Not on file  . Number of children: Not on file  . Years of education: Not on file  . Highest education level: Not on file  Occupational History  . Not on file  Tobacco Use  . Smoking status: Former Smoker    Packs/day: 0.40    Years: 6.00    Pack years: 2.40    Types: Cigarettes    Start date: 08/06/1994    Quit date: 08/05/2000    Years since quitting: 19.5  . Smokeless tobacco: Current User  Substance and Sexual Activity  . Alcohol use: Yes  . Drug use: No  . Sexual activity: Yes  Other Topics Concern  . Not on file  Social History Narrative  . Not on file   Social Determinants of Health   Financial Resource Strain: Not on file  Food Insecurity: Not on file  Transportation Needs: Not on  file  Physical Activity: Not on file  Stress: Not on file  Social Connections: Not on file  Intimate Partner Violence: Not on file    No family history on file.  No past surgical history on file.  ROS: Review of Systems  Eyes:       Notices red piece of flesh growing from medical to lateral across RT iris  Respiratory: Positive for cough (a little). Negative for shortness of breath and wheezing.   Cardiovascular: Negative for chest pain and palpitations.       Notice that pulse rate very low as recorded on his smart watch.  No associated dizziness or SOB  Gastrointestinal: Positive for constipation (uses Miralax  as needed). Negative for diarrhea and vomiting.       Some bloating in stomach at times.  Chew gum during the day.  Drinks 2% milk. Some discomfort around umbilicus at times over the past 24 hrs  Genitourinary: Negative for difficulty urinating.   PHYSICAL EXAM: BP 130/83   Pulse (!) 59   Temp 98.4 F (36.9 C)   Resp 16   Ht 5\' 8"  (1.727 m)   Wt 210 lb 6.4 oz (95.4 kg)   SpO2 97%   BMI 31.99 kg/m   Wt Readings from Last 3 Encounters:  02/21/20 210 lb 6.4 oz (95.4 kg)  01/13/18 210 lb (95.3 kg)  02/11/17 193 lb (87.5 kg)    Physical Exam  General appearance - alert, well appearing, middle-age male and in no distress Mental status - normal mood, behavior, speech, dress, motor activity, and thought processes Eyes - pupils equal and reactive, extraocular eye movements intact Ears - bilateral TM's and external ear canals normal Nose - normal and patent, no erythema, discharge or polyps Mouth - mucous membranes moist, pharynx normal without lesions Neck - supple, no significant adenopathy Lymphatics - no palpable lymphadenopathy, no hepatosplenomegaly Chest - clear to auscultation, no wheezes, rales or rhonchi, symmetric air entry Heart -bradycardic but regular, normal S1, S2, no murmurs, rubs, clicks or gallops Abdomen - soft, nontender, nondistended, no masses or organomegaly Extremities - peripheral pulses normal, no pedal edema, no clubbing or cyanosis  Depression screen [facility] 2/9 02/21/2020 03/24/2019 01/13/2018 12/20/2016 06/25/2016  Decreased Interest 1 0 1 1 1   Down, Depressed, Hopeless 1 0 1 0 0  PHQ - 2 Score 2 0 2 1 1   Altered sleeping 0 - 1 1 1   Tired, decreased energy 1 - 2 0 1  Change in appetite 0 - 2 1 1   Feeling bad or failure about yourself  0 - 0 0 0  Trouble concentrating 1 - 1 1 1   Moving slowly or fidgety/restless 0 - 0 1 0  Suicidal thoughts 0 - 0 0 0  PHQ-9 Score 4 - 8 5 5     CMP Latest Ref Rng & Units 04/09/2019 03/19/2016 07/11/2015  Glucose 65 - 99 mg/dL  85 88 83  BUN 6 - 24 mg/dL 19 13 10   Creatinine 0.76 - 1.27 mg/dL    Sodium 134 - 144 mmol/L 137 138 141  Potassium 3.5 - 5.2 mmol/L 4.6 4.0 3.7  Chloride 96 - 106 mmol/L 99 103 104  CO2 20 - 29 mmol/L 24 26 26   Calcium 8.7 - 10.2 mg/dL 9.2 9.1 9.6  Total Protein 6.1 - 8.1 g/dL - 7.0 -  Total Bilirubin 0.2 - 1.2 mg/dL - 0.5 -  Alkaline Phos 40 - 115 U/L - 65 -  AST 10 -  40 U/L - 26 -  ALT 9 - 46 U/L - 31 -   Lipid Panel     Component Value Date/Time   CHOL 195 04/09/2019 0836   TRIG 112 04/09/2019 0836   HDL 44 04/09/2019 0836   CHOLHDL 4.4 04/09/2019 0836   CHOLHDL 4.2 03/19/2016 0917   VLDL 21 03/19/2016 0917   LDLCALC 131 (H) 04/09/2019 0836    CBC    Component Value Date/Time   WBC 5.7 01/13/2018 0935   WBC 6.9 03/19/2016 0917   RBC 5.13 01/13/2018 0935   RBC 5.14 03/19/2016 0917   HGB 14.8 01/13/2018 0935   HCT 43.3 01/13/2018 0935   PLT 213 01/13/2018 0935   MCV 84 01/13/2018 0935   MCH 28.8 01/13/2018 0935   MCH 28.6 03/19/2016 0917   MCHC 34.2 01/13/2018 0935   MCHC 34.3 03/19/2016 0917   RDW 12.6 01/13/2018 0935   LYMPHSABS 1,794 03/19/2016 0917   MONOABS 414 03/19/2016 0917   EOSABS 69 03/19/2016 0917   BASOSABS 0 03/19/2016 0917   EKG: Sinus bradycardia with pulse rate of 45 good R wave progression.  No ischemic changes. ASSESSMENT AND PLAN:  1. Annual physical exam  2. HYPOTHYROIDISM, POST-RADIOACTIVE IODINE Continue levothyroxine. - TSH  3. Bradycardia Patient bradycardic but asymptomatic.  Questionably due to hypothyroidism.  We are checking TSH today. - EKG 12-Lead - Ambulatory referral to Cardiology  4. Obesity (BMI 30.0-34.9) Discussed and encourage healthy eating habits.  Advised patient to cut back on white carbohydrates, eliminate sugary drinks from the diet, try to eat more white lean meat instead of red meat and incorporate fresh fruits and vegetables into the diet.  Continue to stay active - CBC - Comprehensive  metabolic panel - Lipid panel - Hemoglobin A1c  5. Constipation, unspecified constipation type Continue to use MiraLAX as needed  6. Pterygium of right eye Recommend that he see an ophthalmologist  7. Periumbilical abdominal pain Exam benign today.  No signs of hernia.  Recommend observe for now  8. Influenza vaccine refused Recommended.  Patient declined  9. Screening for hepatitis C declined Recommended.  Patient declined  10. Tetanus, diphtheria, and acellular pertussis (Tdap) vaccination declined Recommended.  Patient declined    Patient was given the opportunity to ask questions.  Patient verbalized understanding of the plan and was able to repeat key elements of the plan.   No orders of the defined types were placed in this encounter.    Requested Prescriptions    No prescriptions requested or ordered in this encounter    No follow-ups on file.  Jonah Blue, MD, FACP

## 2020-02-22 LAB — CBC
Hematocrit: 44.1 % (ref 37.5–51.0)
Hemoglobin: 15.1 g/dL (ref 13.0–17.7)
MCH: 28.2 pg (ref 26.6–33.0)
MCHC: 34.2 g/dL (ref 31.5–35.7)
MCV: 82 fL (ref 79–97)
Platelets: 306 10*3/uL (ref 150–450)
RBC: 5.36 x10E6/uL (ref 4.14–5.80)
RDW: 11.6 % (ref 11.6–15.4)
WBC: 7.9 10*3/uL (ref 3.4–10.8)

## 2020-02-22 LAB — COMPREHENSIVE METABOLIC PANEL
ALT: 43 IU/L (ref 0–44)
AST: 31 IU/L (ref 0–40)
Albumin/Globulin Ratio: 1.6 (ref 1.2–2.2)
Albumin: 4.5 g/dL (ref 4.0–5.0)
Alkaline Phosphatase: 76 IU/L (ref 44–121)
BUN/Creatinine Ratio: 15 (ref 9–20)
BUN: 13 mg/dL (ref 6–24)
Bilirubin Total: 0.3 mg/dL (ref 0.0–1.2)
CO2: 21 mmol/L (ref 20–29)
Calcium: 9.3 mg/dL (ref 8.7–10.2)
Chloride: 101 mmol/L (ref 96–106)
Creatinine, Ser: 0.84 mg/dL (ref 0.76–1.27)
GFR calc Af Amer: 124 mL/min/{1.73_m2} (ref >59)
GFR calc non Af Amer: 107 mL/min/{1.73_m2} (ref >59)
Globulin, Total: 2.8 g/dL (ref 1.5–4.5)
Glucose: 81 mg/dL (ref 65–99)
Potassium: 4 mmol/L (ref 3.5–5.2)
Sodium: 139 mmol/L (ref 134–144)
Total Protein: 7.3 g/dL (ref 6.0–8.5)

## 2020-02-22 LAB — HEMOGLOBIN A1C
Est. average glucose Bld gHb Est-mCnc: 111 mg/dL
Hgb A1c MFr Bld: 5.5 % (ref 4.8–5.6)

## 2020-02-22 LAB — LIPID PANEL
Chol/HDL Ratio: 4.8 ratio (ref 0.0–5.0)
Cholesterol, Total: 197 mg/dL (ref 100–199)
HDL: 41 mg/dL (ref >39)
LDL Chol Calc (NIH): 132 mg/dL — ABNORMAL HIGH (ref 0–99)
Triglycerides: 135 mg/dL (ref 0–149)
VLDL Cholesterol Cal: 24 mg/dL (ref 5–40)

## 2020-02-22 LAB — TSH: TSH: 2.04 u[IU]/mL (ref 0.450–4.500)

## 2020-02-22 NOTE — Progress Notes (Signed)
Let patient know that his blood cell counts including red blood cell, white blood cell and platelet counts are normal.  Kidney and liver function tests are normal.  Thyroid level is normal.  Screening test for diabetes is negative.  LDL cholesterol is 132 with goal being less than 100.  Healthy eating habits and regular exercise will help to lower cholesterol.

## 2020-02-29 ENCOUNTER — Telehealth: Payer: Self-pay

## 2020-02-29 NOTE — Telephone Encounter (Signed)
Contacted pt to go over lab results pt is aware and doesn't have any questions or concerns 

## 2020-03-01 MED FILL — LEVOTHYROXINE 125 MCG TAB: 125 | 30 days supply | Qty: 30 | Fill #0

## 2020-03-04 ENCOUNTER — Other Ambulatory Visit: Payer: Self-pay | Admitting: Internal Medicine

## 2020-03-04 DIAGNOSIS — E018 Other iodine-deficiency related thyroid disorders and allied conditions: Secondary | ICD-10-CM

## 2020-03-23 ENCOUNTER — Ambulatory Visit: Payer: Self-pay | Admitting: Internal Medicine

## 2020-04-04 MED FILL — LEVOTHYROXINE 125 MCG TAB: 125 | 30 days supply | Qty: 30 | Fill #1

## 2020-04-15 ENCOUNTER — Other Ambulatory Visit: Payer: Self-pay

## 2020-04-25 ENCOUNTER — Ambulatory Visit: Payer: Self-pay | Admitting: Internal Medicine

## 2020-04-27 MED FILL — Levothyroxine Sodium Tab 125 MCG: ORAL | 30 days supply | Qty: 30 | Fill #0 | Status: AC

## 2020-05-01 ENCOUNTER — Other Ambulatory Visit: Payer: Self-pay

## 2020-05-17 ENCOUNTER — Encounter: Payer: Self-pay | Admitting: Internal Medicine

## 2020-05-17 NOTE — Progress Notes (Signed)
Seen by Dr. Levora Angel 04/13/20. Colonoscopy recommended for further evaluation of his persistent left upper quadrant abdominal pain. Started on dicyclomine 10 mg 2 capsules 3 times daily as needed Diagnosed with chronic GERD.  Started on pantoprazole delayed release 40 mg daily. Chronic constipation: Advised to use MiraLAX.

## 2020-06-01 MED FILL — Levothyroxine Sodium Tab 125 MCG: ORAL | 30 days supply | Qty: 30 | Fill #1 | Status: AC

## 2020-06-02 ENCOUNTER — Other Ambulatory Visit: Payer: Self-pay

## 2020-06-05 ENCOUNTER — Other Ambulatory Visit: Payer: Self-pay

## 2020-06-08 ENCOUNTER — Encounter: Payer: Self-pay | Admitting: Internal Medicine

## 2020-06-08 NOTE — Progress Notes (Signed)
Received colonoscopy report from [facility] Dr. Levora Angel.  Procedure done 05/18/2020.  The exam portion of the ileum was normal.  Normal mucosa in the entire examined colon.  Diverticulosis in the sigmoid colon.  Internal hemorrhoids.  Repeat colonoscopy in 10 years

## 2020-07-04 ENCOUNTER — Other Ambulatory Visit: Payer: Self-pay

## 2020-07-04 MED FILL — Levothyroxine Sodium Tab 125 MCG: ORAL | 30 days supply | Qty: 30 | Fill #2 | Status: AC

## 2020-07-05 ENCOUNTER — Other Ambulatory Visit: Payer: Self-pay

## 2020-08-02 ENCOUNTER — Other Ambulatory Visit: Payer: Self-pay

## 2020-08-02 MED FILL — Levothyroxine Sodium Tab 125 MCG: ORAL | 30 days supply | Qty: 30 | Fill #3 | Status: AC

## 2020-08-03 ENCOUNTER — Other Ambulatory Visit: Payer: Self-pay

## 2020-08-28 ENCOUNTER — Other Ambulatory Visit: Payer: Self-pay

## 2020-08-28 MED FILL — Levothyroxine Sodium Tab 125 MCG: ORAL | 30 days supply | Qty: 30 | Fill #4 | Status: AC

## 2020-08-29 ENCOUNTER — Other Ambulatory Visit: Payer: Self-pay

## 2020-09-25 ENCOUNTER — Other Ambulatory Visit: Payer: Self-pay

## 2020-09-25 ENCOUNTER — Other Ambulatory Visit: Payer: Self-pay | Admitting: Internal Medicine

## 2020-09-25 DIAGNOSIS — E018 Other iodine-deficiency related thyroid disorders and allied conditions: Secondary | ICD-10-CM

## 2020-09-25 MED ORDER — LEVOTHYROXINE SODIUM 125 MCG PO TABS
ORAL_TABLET | Freq: Every day | ORAL | 4 refills | Status: DC
  Filled 2020-09-25: qty 30, 30d supply, fill #0
  Filled 2020-10-30: qty 30, 30d supply, fill #1
  Filled 2020-12-04: qty 30, 30d supply, fill #2
  Filled 2021-01-01: qty 30, 30d supply, fill #3
  Filled 2021-01-30: qty 30, 30d supply, fill #0
  Filled 2021-01-30: qty 30, 30d supply, fill #4

## 2020-09-26 ENCOUNTER — Other Ambulatory Visit: Payer: Self-pay

## 2020-09-27 ENCOUNTER — Other Ambulatory Visit: Payer: Self-pay

## 2020-10-30 ENCOUNTER — Other Ambulatory Visit: Payer: Self-pay

## 2020-10-31 ENCOUNTER — Ambulatory Visit: Payer: Self-pay | Attending: Internal Medicine | Admitting: Internal Medicine

## 2020-10-31 ENCOUNTER — Other Ambulatory Visit: Payer: Self-pay

## 2020-10-31 ENCOUNTER — Encounter: Payer: Self-pay | Admitting: Internal Medicine

## 2020-10-31 VITALS — BP 144/84 | HR 59 | Resp 16 | Wt 209.8 lb

## 2020-10-31 DIAGNOSIS — Z2821 Immunization not carried out because of patient refusal: Secondary | ICD-10-CM

## 2020-10-31 DIAGNOSIS — H11001 Unspecified pterygium of right eye: Secondary | ICD-10-CM

## 2020-10-31 DIAGNOSIS — E018 Other iodine-deficiency related thyroid disorders and allied conditions: Secondary | ICD-10-CM

## 2020-10-31 NOTE — Progress Notes (Signed)
Patient ID: Daniel Fuller, male    DOB: April 11, 1975  MRN: [id number]  CC: Referral and Hypothyroidism   Subjective: Daniel Fuller is a 45 y.o. male who presents for chronic ds management His concerns today include:  Patient with history of GERD, chronic constipation, rectal itching, hypothyroidism  Pt request referral to eye doctor.  No blurred vision.  Concern about a red spot in RT eye which has been present for yrs.    Thyroid:  taking Levothyroxine consistently, Would like to get TSH check Denies feeling hot or cold all the time.  No unexplained weight changes.  HM: declines flu shot.  Had 2 COVID shots Patient Active Problem List   Diagnosis Date Noted   Influenza vaccine refused 02/21/2020   Pterygium of right eye 02/21/2020   Constipation 02/21/2020   Bradycardia 02/21/2020   Gastroesophageal reflux disease without esophagitis 01/13/2018   Positive depression screening 01/13/2018   Obesity (BMI 30-39.9) 01/13/2018   Bacterial infection due to H. pylori 05/27/2016   Epigastric pain 04/15/2016   HYPOTHYROIDISM, POST-RADIOACTIVE IODINE 01/05/2007     Current Outpatient Medications on File Prior to Visit  Medication Sig Dispense Refill   cetirizine (ZYRTEC ALLERGY) 10 MG tablet Take 1 tablet (10 mg total) by mouth daily. 30 tablet 0   esomeprazole (NEXIUM) 20 MG capsule Take 1 capsule (20 mg total) by mouth daily at 12 noon. 1 tab PO daily 30 capsule 5   hydrocortisone (ANUSOL-HC) 2.5 % rectal cream Apply to rectal area BID PRN itching/burning (Patient not taking: Reported on 10/31/2020) 30 g 0   levothyroxine (SYNTHROID) 125 MCG tablet TAKE 1 TABLET(125 MCG) BY MOUTH DAILY 30 tablet 6   levothyroxine (SYNTHROID) 125 MCG tablet TAKE 1 TABLET (125 MCG TOTAL) BY MOUTH DAILY. 30 tablet 4   miconazole (MICOTIN) 2 % cream Apply to affected area BID (Patient not taking: Reported on 10/31/2020) 28.35 g 0   omeprazole (PRILOSEC) 20 MG capsule Take 1  capsule (20 mg total) by mouth daily as needed. 30 capsule 3   No current facility-administered medications on file prior to visit.    Allergies  Allergen Reactions   Omeprazole     headache    Social History   Socioeconomic History   Marital status: Married    Spouse name: Not on file   Number of children: Not on file   Years of education: Not on file   Highest education level: Not on file  Occupational History   Not on file  Tobacco Use   Smoking status: Former    Packs/day: 0.40    Years: 6.00    Pack years: 2.40    Types: Cigarettes    Start date: 08/06/1994    Quit date: 08/05/2000    Years since quitting: 20.2   Smokeless tobacco: Current  Substance and Sexual Activity   Alcohol use: Yes   Drug use: No   Sexual activity: Yes  Other Topics Concern   Not on file  Social History Narrative   Not on file   Social Determinants of Health   Financial Resource Strain: Not on file  Food Insecurity: Not on file  Transportation Needs: Not on file  Physical Activity: Not on file  Stress: Not on file  Social Connections: Not on file  Intimate Partner Violence: Not on file    No family history on file.  No past surgical history on file.  ROS: Review of Systems Negative except as stated above  PHYSICAL EXAM: BP Marland Kitchen)  144/84   Pulse (!) 59   Resp 16   Wt 209 lb 12.8 oz (95.2 kg)   SpO2 96%   BMI 31.90 kg/m   Physical Exam   General appearance - alert, well appearing, middle-aged Hispanic male and in no distress Eyes: Pupils are equal and reactive.  Extraocular movements intact.  He has slightly erythematous fleshy tissue noted in the right eye medially but does not cross the iris. Neck - supple, no significant adenopathy Chest - clear to auscultation, no wheezes, rales or rhonchi, symmetric air entry Heart - normal rate, regular rhythm, normal S1, S2, no murmurs, rubs, clicks or gallops Extremities - peripheral pulses normal, no pedal edema, no clubbing or  cyanosis  CMP Latest Ref Rng & Units 02/21/2020 04/09/2019 03/19/2016  Glucose 65 - 99 mg/dL 81 85 88  BUN 6 - 24 mg/dL 13 19 13   Creatinine 0.76 - 1.27 mg/dL 4.17 4.08  Sodium 134 - 144 mmol/L 139 137 138  Potassium 3.5 - 5.2 mmol/L 4.0 4.6 4.0  Chloride 96 - 106 mmol/L 101 99 103  CO2 20 - 29 mmol/L 21 24 26   Calcium 8.7 - 10.2 mg/dL 9.3 9.2 9.1  Total Protein 6.0 - 8.5 g/dL 7.3 - 7.0  Total Bilirubin 0.0 - 1.2 mg/dL 0.3 - 0.5  Alkaline Phos 44 - 121 IU/L 76 - 65  AST 0 - 40 IU/L 31 - 26  ALT 0 - 44 IU/L 43 - 31   Lipid Panel     Component Value Date/Time   CHOL 197 02/21/2020 1150   TRIG 135 02/21/2020 1150   HDL 41 02/21/2020 1150   CHOLHDL 4.8 02/21/2020 1150   CHOLHDL 4.2 03/19/2016 0917   VLDL 21 03/19/2016 0917   LDLCALC 132 (H) 02/21/2020 1150    CBC    Component Value Date/Time   WBC 7.9 02/21/2020 1150   WBC 6.9 03/19/2016 0917   RBC 5.36 02/21/2020 1150   RBC 5.14 03/19/2016 0917   HGB 15.1 02/21/2020 1150   HCT 44.1 02/21/2020 1150   PLT 306 02/21/2020 1150   MCV 82 02/21/2020 1150   MCH 28.2 02/21/2020 1150   MCH 28.6 03/19/2016 0917   MCHC 34.2 02/21/2020 1150   MCHC 34.3 03/19/2016 0917   RDW 11.6 02/21/2020 1150   LYMPHSABS 1,794 03/19/2016 0917   MONOABS 414 03/19/2016 0917   EOSABS 69 03/19/2016 0917   BASOSABS 0 03/19/2016 0917    ASSESSMENT AND PLAN: 1. HYPOTHYROIDISM, POST-RADIOACTIVE IODINE Continue current dose of levothyroxine. - TSH  2. Pterygium of right eye Advised patient of the importance of using protective lenses like shades when exposed to bright sunlight and also protecting the eyes when in very windy conditions.  Recommend routine eye exam at least once every 2 years.  Gave him the names of places where he can get an eye exam done at a reasonable cost.   Right now this condition is not bothersome to him and he is uninsured.  3. Influenza vaccination declined Recommended.  Patient declined.    Patient was given the  opportunity to ask questions.  Patient verbalized understanding of the plan and was able to repeat key elements of the plan.   No orders of the defined types were placed in this encounter.    Requested Prescriptions    No prescriptions requested or ordered in this encounter    No follow-ups on file.  05/19/2016, MD, FACP

## 2020-11-01 ENCOUNTER — Telehealth: Payer: Self-pay

## 2020-11-01 LAB — TSH: TSH: 3.12 u[IU]/mL (ref 0.450–4.500)

## 2020-11-01 NOTE — Telephone Encounter (Signed)
Contacted pt to go over lab results pt is aware and doesn't have any questions or concerns 

## 2020-12-04 ENCOUNTER — Other Ambulatory Visit: Payer: Self-pay

## 2021-01-01 ENCOUNTER — Other Ambulatory Visit: Payer: Self-pay

## 2021-01-30 ENCOUNTER — Other Ambulatory Visit: Payer: Self-pay

## 2021-02-15 ENCOUNTER — Other Ambulatory Visit: Payer: Self-pay

## 2021-02-15 ENCOUNTER — Other Ambulatory Visit: Payer: Self-pay | Admitting: Internal Medicine

## 2021-02-15 DIAGNOSIS — E018 Other iodine-deficiency related thyroid disorders and allied conditions: Secondary | ICD-10-CM

## 2021-02-15 MED ORDER — LEVOTHYROXINE SODIUM 125 MCG PO TABS
ORAL_TABLET | Freq: Every day | ORAL | 2 refills | Status: DC
Start: 2021-02-15 — End: 2021-05-31
  Filled 2021-02-15: qty 30, fill #0
  Filled 2021-02-26: qty 30, 30d supply, fill #0
  Filled 2021-03-31: qty 30, 30d supply, fill #1
  Filled 2021-05-01: qty 30, 30d supply, fill #2

## 2021-02-16 ENCOUNTER — Other Ambulatory Visit: Payer: Self-pay

## 2021-02-26 ENCOUNTER — Other Ambulatory Visit: Payer: Self-pay

## 2021-02-28 ENCOUNTER — Other Ambulatory Visit: Payer: Self-pay

## 2021-04-02 ENCOUNTER — Other Ambulatory Visit: Payer: Self-pay

## 2021-04-03 ENCOUNTER — Other Ambulatory Visit: Payer: Self-pay

## 2021-04-19 ENCOUNTER — Other Ambulatory Visit (HOSPITAL_COMMUNITY): Payer: Self-pay

## 2021-05-01 ENCOUNTER — Other Ambulatory Visit: Payer: Self-pay

## 2021-05-30 ENCOUNTER — Ambulatory Visit: Payer: Self-pay | Attending: Physician Assistant | Admitting: Physician Assistant

## 2021-05-30 ENCOUNTER — Encounter: Payer: Self-pay | Admitting: Physician Assistant

## 2021-05-30 VITALS — BP 135/84 | HR 65 | Wt 206.2 lb

## 2021-05-30 DIAGNOSIS — E7800 Pure hypercholesterolemia, unspecified: Secondary | ICD-10-CM

## 2021-05-30 DIAGNOSIS — Z131 Encounter for screening for diabetes mellitus: Secondary | ICD-10-CM

## 2021-05-30 DIAGNOSIS — E018 Other iodine-deficiency related thyroid disorders and allied conditions: Secondary | ICD-10-CM

## 2021-05-30 NOTE — Progress Notes (Signed)
Patient ID: Daniel Fuller, male   DOB: 03/14/1975, 46 y.o.   MRN: [id number]   Daniel Fuller, is a 46 y.o. male  TGY:[id number]  KAJ:[id number]  DOB - [birth year]  Chief Complaint  Patient presents with   Medication Refill       Subjective:   Daniel Fuller is a 46 y.o. male here today for med RF.  No new issues or concerns.  No CP/SOB.    No problems updated.  ALLERGIES: Allergies  Allergen Reactions   Omeprazole     headache    PAST MEDICAL HISTORY: Past Medical History:  Diagnosis Date   Thyroid disease     MEDICATIONS AT HOME: Prior to Admission medications   Medication Sig Start Date End Date Taking? Authorizing Provider  cetirizine (ZYRTEC ALLERGY) 10 MG tablet Take 1 tablet (10 mg total) by mouth daily. 11/01/19   Wallis Bamberg, PA-C  hydrocortisone (ANUSOL-HC) 2.5 % rectal cream Apply to rectal area BID PRN itching/burning Patient not taking: Reported on 10/31/2020 01/13/18   Marcine Matar, MD  levothyroxine (SYNTHROID) 125 MCG tablet TAKE 1 TABLET (125 MCG TOTAL) BY MOUTH DAILY. 02/15/21 02/15/22  Marcine Matar, MD  miconazole (MICOTIN) 2 % cream Apply to affected area BID Patient not taking: Reported on 10/31/2020 08/14/18   Marcine Matar, MD    ROS: Neg HEENT Neg resp Neg cardiac Neg GI Neg GU Neg MS Neg psych Neg neuro  Objective:   Vitals:   05/30/21 1621  BP: 135/84  Pulse: 65  SpO2: 97%  Weight: 206 lb 3.2 oz (93.5 kg)   Exam General appearance : Awake, alert, not in any distress. Speech Clear. Not toxic looking HEENT: Atraumatic and Normocephalic Neck: Supple, no JVD. No cervical lymphadenopathy.  Chest: Good air entry bilaterally, CTAB.  No rales/rhonchi/wheezing CVS: S1 S2 regular, no murmurs.  Extremities: B/L Lower Ext shows no edema, both legs are warm to touch Neurology: Awake alert, and oriented X 3, CN II-XII intact, Non focal Skin: No Rash  Data Review Lab Results  Component  Value Date   HGBA1C 5.5 02/21/2020   HGBA1C 5.4 04/09/2019   HGBA1C 5.4 01/13/2018    Assessment & Plan   1. HYPOTHYROIDISM, POST-RADIOACTIVE IODINE I'll send meds once I get labs back-currently on daily.  TSH came back normal-continue current dose-Rx sent - Thyroid Panel With TSH - Comprehensive metabolic panel  2. Screening for diabetes mellitus - Hemoglobin A1c  3. Elevated LDL cholesterol level - Lipid panel Lipids came back high-sent Rx atorvastatin 20mg     Return in about 6 months (around 11/30/2021) for Pcp .  The patient was given clear instructions to go to ER or return to medical center if symptoms don't improve, worsen or new problems develop. The patient verbalized understanding. The patient was told to call to get lab results if they haven't heard anything in the next week.      12/02/2021, PA-C [facility] [place], [place]   05/30/2021, 4:26 PM

## 2021-05-31 ENCOUNTER — Other Ambulatory Visit: Payer: Self-pay

## 2021-05-31 ENCOUNTER — Other Ambulatory Visit: Payer: Self-pay | Admitting: Physician Assistant

## 2021-05-31 DIAGNOSIS — E7800 Pure hypercholesterolemia, unspecified: Secondary | ICD-10-CM

## 2021-05-31 DIAGNOSIS — Z79899 Other long term (current) drug therapy: Secondary | ICD-10-CM

## 2021-05-31 DIAGNOSIS — E018 Other iodine-deficiency related thyroid disorders and allied conditions: Secondary | ICD-10-CM

## 2021-05-31 LAB — COMPREHENSIVE METABOLIC PANEL
ALT: 39 IU/L (ref 0–44)
AST: 39 IU/L (ref 0–40)
Albumin/Globulin Ratio: 1.8 (ref 1.2–2.2)
Albumin: 4.6 g/dL (ref 4.0–5.0)
Alkaline Phosphatase: 86 IU/L (ref 44–121)
BUN/Creatinine Ratio: 20 (ref 9–20)
BUN: 17 mg/dL (ref 6–24)
Bilirubin Total: 0.2 mg/dL (ref 0.0–1.2)
CO2: 25 mmol/L (ref 20–29)
Calcium: 9.2 mg/dL (ref 8.7–10.2)
Chloride: 104 mmol/L (ref 96–106)
Creatinine, Ser: 0.86 mg/dL (ref 0.76–1.27)
Globulin, Total: 2.5 g/dL (ref 1.5–4.5)
Glucose: 82 mg/dL (ref 70–99)
Potassium: 4.3 mmol/L (ref 3.5–5.2)
Sodium: 142 mmol/L (ref 134–144)
Total Protein: 7.1 g/dL (ref 6.0–8.5)
eGFR: 109 mL/min/{1.73_m2} (ref >59)

## 2021-05-31 LAB — THYROID PANEL WITH TSH
Free Thyroxine Index: 2.7 (ref 1.2–4.9)
T3 Uptake Ratio: 28 % (ref 24–39)
T4, Total: 9.8 ug/dL (ref 4.5–12.0)
TSH: 2.84 u[IU]/mL (ref 0.450–4.500)

## 2021-05-31 LAB — HEMOGLOBIN A1C
Est. average glucose Bld gHb Est-mCnc: 111 mg/dL
Hgb A1c MFr Bld: 5.5 % (ref 4.8–5.6)

## 2021-05-31 LAB — LIPID PANEL
Chol/HDL Ratio: 4.8 ratio (ref 0.0–5.0)
Cholesterol, Total: 224 mg/dL — ABNORMAL HIGH (ref 100–199)
HDL: 47 mg/dL (ref >39)
LDL Chol Calc (NIH): 143 mg/dL — ABNORMAL HIGH (ref 0–99)
Triglycerides: 187 mg/dL — ABNORMAL HIGH (ref 0–149)
VLDL Cholesterol Cal: 34 mg/dL (ref 5–40)

## 2021-05-31 MED ORDER — ATORVASTATIN CALCIUM 20 MG PO TABS
20.0000 mg | ORAL_TABLET | Freq: Every day | ORAL | 3 refills | Status: DC
  Filled 2021-05-31: qty 90, 90d supply, fill #0
  Filled 2021-11-28: qty 90, 90d supply, fill #1

## 2021-05-31 MED ORDER — LEVOTHYROXINE SODIUM 125 MCG PO TABS
ORAL_TABLET | Freq: Every day | ORAL | 1 refills | Status: DC
  Filled 2021-05-31: qty 90, 90d supply, fill #0
  Filled 2021-08-29: qty 90, 90d supply, fill #1

## 2021-06-05 ENCOUNTER — Other Ambulatory Visit: Payer: Self-pay

## 2021-08-29 ENCOUNTER — Other Ambulatory Visit: Payer: Self-pay

## 2021-08-31 ENCOUNTER — Other Ambulatory Visit: Payer: Self-pay

## 2021-11-21 ENCOUNTER — Other Ambulatory Visit: Payer: Self-pay

## 2021-11-21 ENCOUNTER — Other Ambulatory Visit: Payer: Self-pay | Admitting: Physician Assistant

## 2021-11-21 DIAGNOSIS — E018 Other iodine-deficiency related thyroid disorders and allied conditions: Secondary | ICD-10-CM

## 2021-11-21 MED ORDER — LEVOTHYROXINE SODIUM 125 MCG PO TABS
125.0000 ug | ORAL_TABLET | Freq: Every day | ORAL | 1 refills | Status: DC
  Filled 2021-11-21: qty 90, 90d supply, fill #0
  Filled 2022-02-07: qty 90, 90d supply, fill #1

## 2021-11-21 NOTE — Telephone Encounter (Signed)
Requested Prescriptions  Pending Prescriptions Disp Refills   levothyroxine (SYNTHROID) 125 MCG tablet 90 tablet 1    Sig: TAKE 1 TABLET (125 MCG TOTAL) BY MOUTH DAILY.     Endocrinology:  Hypothyroid Agents Passed - 11/21/2021  8:37 AM      Passed - TSH in normal range and within 360 days    TSH  Date Value Ref Range Status  05/30/2021 2.840 0.450 - 4.500 uIU/mL Final         Passed - Valid encounter within last 12 months    Recent Outpatient Visits           5 months ago HYPOTHYROIDISM, POST-RADIOACTIVE IODINE   [facility] [place], [place], [place]   1 year ago HYPOTHYROIDISM, POST-RADIOACTIVE IODINE   [facility] Marcine Matar, MD   1 year ago Annual physical exam   [facility] Marcine Matar, MD   2 years ago Screening, lipid   [facility] [place], [place], [place]   3 years ago Gastroesophageal reflux disease without esophagitis   [facility] Marcine Matar, MD       Future Appointments             In 2 months Laural Benes Binnie Rail, MD [facility]

## 2021-11-22 ENCOUNTER — Other Ambulatory Visit: Payer: Self-pay

## 2021-11-28 ENCOUNTER — Other Ambulatory Visit: Payer: Self-pay

## 2021-11-29 ENCOUNTER — Other Ambulatory Visit: Payer: Self-pay

## 2022-01-29 ENCOUNTER — Ambulatory Visit: Payer: Self-pay | Admitting: Internal Medicine

## 2022-02-07 ENCOUNTER — Other Ambulatory Visit: Payer: Self-pay

## 2022-02-11 ENCOUNTER — Other Ambulatory Visit: Payer: Self-pay

## 2022-04-09 ENCOUNTER — Ambulatory Visit: Payer: Self-pay | Attending: Internal Medicine | Admitting: Internal Medicine

## 2022-04-09 ENCOUNTER — Other Ambulatory Visit: Payer: Self-pay

## 2022-04-09 ENCOUNTER — Encounter: Payer: Self-pay | Admitting: Internal Medicine

## 2022-04-09 VITALS — BP 125/74 | HR 56 | Temp 98.4°F | Ht 68.0 in | Wt 202.0 lb

## 2022-04-09 DIAGNOSIS — E018 Other iodine-deficiency related thyroid disorders and allied conditions: Secondary | ICD-10-CM

## 2022-04-09 DIAGNOSIS — E782 Mixed hyperlipidemia: Secondary | ICD-10-CM

## 2022-04-09 MED ORDER — LEVOTHYROXINE SODIUM 125 MCG PO TABS
125.0000 ug | ORAL_TABLET | Freq: Every day | ORAL | 1 refills | Status: DC
  Filled 2022-04-09 – 2022-05-27 (×2): qty 90, 90d supply, fill #0
  Filled 2022-08-27: qty 90, 90d supply, fill #1

## 2022-04-09 NOTE — Progress Notes (Signed)
Patient ID: Daniel Fuller, male    DOB: 01/31/1975  MRN: [id number]  CC: Hypothyroidism (Hypothyroidism f/u. Med refill. /Discuss atorvastatin - currently not taking. /No to flu vax. No to Tdap. )   Subjective: Daniel Fuller is a 47 y.o. male who presents for chronic ds management His concerns today include:  Patient with history of GERD, chronic constipation, rectal itching, hypothyroidism, HL   Last saw me 10/2020 Hypothyroid: Reports compliance with taking levothyroxine 125 mcg daily.  No palpitations.  No feeling of being hot or cold all the time. He had seen our physician assistant 1 year ago.  Lipid profile revealed LDL of 143 and total cholesterol of 224.  She prescribed atorvastatin.  He took it up until November of last year.  He tolerated the medicine well without problems.  Gives no particular reason for stopping the medication.  Patient Active Problem List   Diagnosis Date Noted   Influenza vaccine refused 02/21/2020   Pterygium of right eye 02/21/2020   Constipation 02/21/2020   Bradycardia 02/21/2020   Gastroesophageal reflux disease without esophagitis 01/13/2018   Positive depression screening 01/13/2018   Obesity (BMI 30-39.9) 01/13/2018   Bacterial infection due to H. pylori 05/27/2016   Epigastric pain 04/15/2016   HYPOTHYROIDISM, POST-RADIOACTIVE IODINE 01/05/2007     Current Outpatient Medications on File Prior to Visit  Medication Sig Dispense Refill   levothyroxine (SYNTHROID) 125 MCG tablet Take 1 tablet (125 mcg total) by mouth daily. 90 tablet 1   atorvastatin (LIPITOR) 20 MG tablet Take 1 tablet (20 mg total) by mouth daily. (Patient not taking: Reported on 04/09/2022) 90 tablet 3   cetirizine (ZYRTEC ALLERGY) 10 MG tablet Take 1 tablet (10 mg total) by mouth daily. (Patient not taking: Reported on 04/09/2022) 30 tablet 0   hydrocortisone (ANUSOL-HC) 2.5 % rectal cream Apply to rectal area BID PRN itching/burning (Patient not  taking: Reported on 10/31/2020) 30 g 0   miconazole (MICOTIN) 2 % cream Apply to affected area BID (Patient not taking: Reported on 10/31/2020) 28.35 g 0   No current facility-administered medications on file prior to visit.    Allergies  Allergen Reactions   Omeprazole     headache    Social History   Socioeconomic History   Marital status: Married    Spouse name: Not on file   Number of children: Not on file   Years of education: Not on file   Highest education level: Not on file  Occupational History   Not on file  Tobacco Use   Smoking status: Former    Packs/day: 0.40    Years: 6.00    Additional pack years: 0.00    Total pack years: 2.40    Types: Cigarettes    Start date: 08/06/1994    Quit date: 08/05/2000    Years since quitting: 21.6   Smokeless tobacco: Current  Substance and Sexual Activity   Alcohol use: Yes   Drug use: No   Sexual activity: Yes  Other Topics Concern   Not on file  Social History Narrative   Not on file   Social Determinants of Health   Financial Resource Strain: Not on file  Food Insecurity: Not on file  Transportation Needs: Not on file  Physical Activity: Not on file  Stress: Not on file  Social Connections: Not on file  Intimate Partner Violence: Not on file    No family history on file.  No past surgical history on file.  ROS: Review  of Systems Negative except as stated above  PHYSICAL EXAM: BP 125/74 (BP Location: Left Arm, Patient Position: Sitting, Cuff Size: Normal)   Pulse (!) 56   Temp 98.4 F (36.9 C) (Oral)   Ht 5\' 8"  (1.727 m)   Wt 202 lb (91.6 kg)   SpO2 97%   BMI 30.71 kg/m   Wt Readings from Last 3 Encounters:  04/09/22 202 lb (91.6 kg)  05/30/21 206 lb 3.2 oz (93.5 kg)  10/31/20 209 lb 12.8 oz (95.2 kg)    Physical Exam   General appearance - alert, well appearing, and in no distress Mental status - normal mood, behavior, speech, dress, motor activity, and thought processes Neck - supple,  no significant adenopathy Chest - clear to auscultation, no wheezes, rales or rhonchi, symmetric air entry Heart - normal rate, regular rhythm, normal S1, S2, no murmurs, rubs, clicks or gallops Extremities - peripheral pulses normal, no pedal edema, no clubbing or cyanosis  The 10-year ASCVD risk score (Arnett DK, et al., 2019) is: 2.5%   Values used to calculate the score:     Age: 8 years     Sex: Male     Is Non-Hispanic African American: No     Diabetic: No     Tobacco smoker: No     Systolic Blood Pressure: 0000000 mmHg     Is BP treated: No     HDL Cholesterol: 47 mg/dL     Total Cholesterol: 224 mg/dL     Latest Ref Rng & Units 05/30/2021    4:30 PM 02/21/2020   11:50 AM 04/09/2019    8:36 AM  CMP  Glucose 70 - 99 mg/dL 82  81  85   BUN 6 - 24 mg/dL 17  13  19    Creatinine 0.76 - 1.27 mg/dL 0.86  0.84  0.87   Sodium 134 - 144 mmol/L 142  139  137   Potassium 3.5 - 5.2 mmol/L 4.3  4.0  4.6   Chloride 96 - 106 mmol/L 104  101  99   CO2 20 - 29 mmol/L 25  21  24    Calcium 8.7 - 10.2 mg/dL 9.2  9.3  9.2   Total Protein 6.0 - 8.5 g/dL 7.1  7.3    Total Bilirubin 0.0 - 1.2 mg/dL 0.2  0.3    Alkaline Phos 44 - 121 IU/L 86  76    AST 0 - 40 IU/L 39  31    ALT 0 - 44 IU/L 39  43     Lipid Panel     Component Value Date/Time   CHOL 224 (H) 05/30/2021 1630   TRIG 187 (H) 05/30/2021 1630   HDL 47 05/30/2021 1630   CHOLHDL 4.8 05/30/2021 1630   CHOLHDL 4.2 03/19/2016 0917   VLDL 21 03/19/2016 0917   LDLCALC 143 (H) 05/30/2021 1630    CBC    Component Value Date/Time   WBC 7.9 02/21/2020 1150   WBC 6.9 03/19/2016 0917   RBC 5.36 02/21/2020 1150   RBC 5.14 03/19/2016 0917   HGB 15.1 02/21/2020 1150   HCT 44.1 02/21/2020 1150   PLT 306 02/21/2020 1150   MCV 82 02/21/2020 1150   MCH 28.2 02/21/2020 1150   MCH 28.6 03/19/2016 0917   MCHC 34.2 02/21/2020 1150   MCHC 34.3 03/19/2016 0917   RDW 11.6 02/21/2020 1150   LYMPHSABS 1,794 03/19/2016 0917   MONOABS 414  03/19/2016 0917   EOSABS 69 03/19/2016 0917  BASOSABS 0 03/19/2016 0917    ASSESSMENT AND PLAN:  1. HYPOTHYROIDISM, POST-RADIOACTIVE IODINE Continue levothyroxine.  We will check TSH today. - levothyroxine (SYNTHROID) 125 MCG tablet; Take 1 tablet (125 mcg total) by mouth daily.  Dispense: 90 tablet; Refill: 1 - TSH  2. Mixed hyperlipidemia We will check lipid profile today.  Based on ASCVD score today, he does not need to be back on atorvastatin at this time.  Encourage healthy eating. - Lipid panel - Hepatic Function Panel    Patient was given the opportunity to ask questions.  Patient verbalized understanding of the plan and was able to repeat key elements of the plan.   This documentation was completed using Radio producer.  Any transcriptional errors are unintentional.  No orders of the defined types were placed in this encounter.    Requested Prescriptions   Pending Prescriptions Disp Refills   levothyroxine (SYNTHROID) 125 MCG tablet 90 tablet 1    Sig: Take 1 tablet (125 mcg total) by mouth daily.    No follow-ups on file.  Karle Plumber, MD, FACP

## 2022-04-10 LAB — LIPID PANEL
Chol/HDL Ratio: 4.4 ratio (ref 0.0–5.0)
Cholesterol, Total: 207 mg/dL — ABNORMAL HIGH (ref 100–199)
HDL: 47 mg/dL (ref >39)
LDL Chol Calc (NIH): 129 mg/dL — ABNORMAL HIGH (ref 0–99)
Triglycerides: 177 mg/dL — ABNORMAL HIGH (ref 0–149)
VLDL Cholesterol Cal: 31 mg/dL (ref 5–40)

## 2022-04-10 LAB — TSH: TSH: 3.04 u[IU]/mL (ref 0.450–4.500)

## 2022-04-10 LAB — HEPATIC FUNCTION PANEL
ALT: 29 IU/L (ref 0–44)
AST: 31 IU/L (ref 0–40)
Albumin: 4.4 g/dL (ref 4.1–5.1)
Alkaline Phosphatase: 84 IU/L (ref 44–121)
Bilirubin Total: 0.2 mg/dL (ref 0.0–1.2)
Bilirubin, Direct: <0.1 mg/dL (ref 0.00–0.40)
Total Protein: 6.9 g/dL (ref 6.0–8.5)

## 2022-05-27 ENCOUNTER — Other Ambulatory Visit: Payer: Self-pay

## 2022-05-28 ENCOUNTER — Other Ambulatory Visit: Payer: Self-pay

## 2022-08-20 ENCOUNTER — Ambulatory Visit: Payer: Self-pay | Attending: Internal Medicine

## 2022-08-20 DIAGNOSIS — Z23 Encounter for immunization: Secondary | ICD-10-CM

## 2022-08-20 NOTE — Progress Notes (Signed)
Hepatitis B recombinant vaccine administered  left deltoid  per protocols.  Information sheet given. Patient denies and pain or discomfort at injection site. Tolerated injection well no reaction.

## 2022-08-27 ENCOUNTER — Other Ambulatory Visit: Payer: Self-pay

## 2022-10-14 ENCOUNTER — Ambulatory Visit: Payer: Self-pay | Attending: Internal Medicine | Admitting: Internal Medicine

## 2022-11-15 ENCOUNTER — Other Ambulatory Visit: Payer: Self-pay | Admitting: Internal Medicine

## 2022-11-15 ENCOUNTER — Other Ambulatory Visit: Payer: Self-pay

## 2022-11-15 DIAGNOSIS — E018 Other iodine-deficiency related thyroid disorders and allied conditions: Secondary | ICD-10-CM

## 2022-11-15 MED ORDER — LEVOTHYROXINE SODIUM 125 MCG PO TABS
125.0000 ug | ORAL_TABLET | Freq: Every day | ORAL | 0 refills | Status: DC
  Filled 2022-11-15: qty 90, 90d supply, fill #0

## 2022-11-15 NOTE — Telephone Encounter (Signed)
Requested Prescriptions  Pending Prescriptions Disp Refills   levothyroxine (SYNTHROID) 125 MCG tablet 90 tablet 0    Sig: Take 1 tablet (125 mcg total) by mouth daily.     Endocrinology:  Hypothyroid Agents Passed - 11/15/2022  8:27 AM      Passed - TSH in normal range and within 360 days    TSH  Date Value Ref Range Status  04/09/2022 3.040 0.450 - 4.500 uIU/mL Final         Passed - Valid encounter within last 12 months    Recent Outpatient Visits           7 months ago HYPOTHYROIDISM, POST-RADIOACTIVE IODINE   [place] [facility] Marcine Matar, MD   1 year ago HYPOTHYROIDISM, POST-RADIOACTIVE IODINE   [place] [facility] [place], [place], [place]   2 years ago HYPOTHYROIDISM, POST-RADIOACTIVE IODINE   [place] [facility] Marcine Matar, MD   2 years ago Annual physical exam   [place] [facility] Marcine Matar, MD   3 years ago Screening, lipid   [place] [facility] [place], [place], [place]

## 2022-11-18 ENCOUNTER — Other Ambulatory Visit: Payer: Self-pay

## 2023-02-19 ENCOUNTER — Ambulatory Visit: Payer: Self-pay | Attending: Physician Assistant | Admitting: Physician Assistant

## 2023-02-19 ENCOUNTER — Encounter: Payer: Self-pay | Admitting: Physician Assistant

## 2023-02-19 VITALS — BP 130/78 | HR 48 | Temp 98.0°F | Resp 20 | Wt 206.0 lb

## 2023-02-19 DIAGNOSIS — E018 Other iodine-deficiency related thyroid disorders and allied conditions: Secondary | ICD-10-CM

## 2023-02-19 DIAGNOSIS — E782 Mixed hyperlipidemia: Secondary | ICD-10-CM

## 2023-02-19 NOTE — Progress Notes (Signed)
 Patient ID: Daniel Fuller, male   DOB: Aug 20, 1975, 48 y.o.   MRN: [id number]   Daniel Fuller, is a 48 y.o. male  [id number]  [id number]  DOB - 07-May-1975  Chief Complaint  Patient presents with   Hypothyroidism   joint aches       Subjective:   Daniel Fuller is a 48 y.o. male here today for thyroid  check and med RF.  He occasionally has various joint pain but this is not pervasive.  It is not specific to any certain joint/joints.  He denies fever, tick bites, or night sweats.  He says advil helps.  He denies joint swelling.  Denies HA/CP/SOB/dizziness.  No diabetes in family.  He ate 2 cookies this morning.    No problems updated.  ALLERGIES: Allergies  Allergen Reactions   Omeprazole      headache    PAST MEDICAL HISTORY: Past Medical History:  Diagnosis Date   Thyroid  disease     MEDICATIONS AT HOME: Prior to Admission medications   Medication Sig Start Date End Date Taking? Authorizing Provider  levothyroxine  (SYNTHROID ) 125 MCG tablet Take 1 tablet (125 mcg total) by mouth daily. 11/15/22  Yes Vicci Barnie NOVAK, MD    ROS: Neg HEENT Neg resp Neg cardiac Neg GI Neg GU Neg psych Neg neuro  Objective:   Vitals:   02/19/23 0934  BP: 130/78  Pulse: (!) 48  Resp: 20  Temp: 98 F (36.7 C)  SpO2: 98%  Weight: 206 lb (93.4 kg)   Exam General appearance : Awake, alert, not in any distress. Speech Clear. Not toxic looking HEENT: Atraumatic and Normocephalic Neck: Supple, no JVD. No cervical lymphadenopathy.  Chest: Good air entry bilaterally, CTAB.  No rales/rhonchi/wheezing CVS: S1 S2 regular, no murmurs.  Extremities: B/L Lower Ext shows no edema, both legs are warm to touch Neurology: Awake alert, and oriented X 3, CN II-XII intact, Non focal Skin: No Rash  Data Review Lab Results  Component Value Date   HGBA1C 5.5 05/30/2021   HGBA1C 5.5 02/21/2020   HGBA1C 5.4 04/09/2019    Assessment & Plan   1.  HYPOTHYROIDISM, POST-RADIOACTIVE IODINE (Primary) Will send levothyroxine  tomorrow when labs are back.  Currently on 125mcg - Lipid Panel - Thyroid  Panel With TSH - CMP14+EGFR  2. Mixed hyperlipidemia Gave diet information - Lipid Panel - CMP14+EGFR    Return in about 6 months (around 08/19/2023) for PCP for chronic conditions-[place].  The patient was given clear instructions to go to ER or return to medical center if symptoms don't improve, worsen or new problems develop. The patient verbalized understanding. The patient was told to call to get lab results if they haven't heard anything in the next week.      Jon Moores, PA-C [facility] and [facility] [place], [place] [phone]   02/19/2023, 10:02 AM

## 2023-02-19 NOTE — Progress Notes (Signed)
 Follow thyroid  and medication refill Aching joints

## 2023-02-19 NOTE — Patient Instructions (Signed)
Colesterol elevado High Cholesterol  El colesterol elevado es una afeccin que se caracteriza porque la sangre tiene niveles altos de una sustancia [place], cerosa y parecida a la grasa (colesterol). El hgado fabrica todo el colesterol que el organismo necesita. El organismo humano necesita pequeas cantidades de colesterol para formar las clulas. El exceso de colesterol se obtiene de los alimentos que se consumen. La sangre transporta el colesterol desde el hgado al resto del cuerpo. Si tiene el colesterol elevado, pueden acumularse depsitos (placas) en las paredes de las arterias. Las arterias son los vasos sanguneos que transportan la sangre desde el corazn al resto del cuerpo. Las placas causan que las arterias se Armed forces training and education officer y se endurezcan. Las placas de colesterol aumentan el riesgo de sufrir un infarto de miocardio y un accidente cerebrovascular. Trabaje con el mdico para [employer] concentraciones de colesterol en un rango saludable. Qu incrementa el riesgo? Los siguientes factores pueden hacer que sea ms propenso a Aeronautical engineer afeccin: Consumir alimentos con alto contenido de grasa animal (grasa saturada) o colesterol. Tener sobrepeso. No hacer suficiente ejercicio fsico. Tener antecedentes familiares de colesterol alto (hipercolesterolemia familiar). Consumir productos con tabaco. Tener diabetes. Cules son los signos o sntomas? En la [employer], el colesterol alto no causa ningn sntoma por lo general. En los [place] graves, los niveles muy altos de colesterol pueden causar: Protuberancias de grasa debajo de la piel (xantomas). Un anillo blanco o gris alrededor del centro negro (pupila) del ojo. Cmo se diagnostica? Esta afeccin se puede diagnosticar en funcin de los [address] de un anlisis de [place]. Si es mayor de 20 aos, es posible que el mdico le controle el nivel de colesterol cada 4 a 6 aos. Los controles pueden ser ms frecuentes si tiene el  colesterol elevado u otros factores de riesgo de enfermedad cardaca. En el anlisis de sangre de [place], se determina lo siguiente: El colesterol "malo", o colesterol LDL. Este es el principal tipo de colesterol que causa enfermedades cardacas. El nivel recomendado es de menos de 100 mg/dl (1.19 mmol/l). El colesterol "bueno", o colesterol HDL. El HDL ayuda a proteger contra la enfermedad cardaca porque limpia las arterias y arrastra el LDL al hgado para que lo procese. El nivel recomendado de HDL es de 60 mg/dl (1.47 mmol/l) o ms. Triglicridos. Estos son grasas que el organismo puede Academic librarian o quemar como fuente de [place]. El nivel recomendado es de menos de 150 mg/dl (8.29 mmol/l). Colesterol total. Mide la cantidad total de colesterol en la sangre, e incluye el colesterol LDL, el colesterol HDL y los triglicridos. El nivel recomendado es de menos de 200 mg/dl (5.62 mmol/l). Cmo se trata? El tratamiento para el colesterol alto comienza con cambios en el estilo de vida, tales como dieta y ejercicio. Cambios en la dieta. Es posible que le indiquen que consuma alimentos con ms [place] y menos grasas saturadas o Engineer, mining. Cambios en el estilo de vida. Estos pueden incluir hacer actividad fsica con regularidad, mantener un peso saludable y dejar de consumir productos con tabaco. Medicamentos. Estos se administran cuando los [employer] dieta y en el estilo de vida no han sido eficaces. Es posible que le receten medicamentos llamados estatinas para bajar sus niveles de colesterol. Siga estas instrucciones en su casa: Comida y bebida  Siga una dieta saludable y [place]. Esta dieta incluye lo siguiente: Porciones diarias de frutas y verduras frescas, congeladas o enlatadas. Porciones diarias de alimentos integrales con alto contenido de [place]. Alimentos  con bajo contenido de grasas saturadas y grasas trans. Estos incluyen carne de ave y pescado sin piel, cortes de carne magros  y productos lcteos descremados. Una variedad de pescado, especialmente pescado graso que contenga cidos grasos omega 3. Propngase comer pescado al [employer] veces por semana. Evite los alimentos y las bebidas que tengan Engineer, mining. Use mtodos de coccin saludables, como asar, Software engineer, hervir, hornear, escalfar, cocer al vapor y Actor. No fra los alimentos excepto para saltearlos. Si bebe alcohol: Limite la cantidad que bebe a lo siguiente: De 0 a 1 medida por da para las mujeres que no estn embarazadas. De 0 a 2 medidas por da para los hombres. Sepa cunta cantidad de alcohol hay en las bebidas. En los [address], una medida equivale a una botella de cerveza de 12 oz (355 ml), un vaso de vino de 5 oz (148 ml) o un vaso de una bebida alcohlica de alta graduacin de 1 oz (44 ml). Estilo de vida  Haga ejercicio con regularidad. Trate de hacer un total de 150 minutos de actividad fsica por semana. Aumente la cantidad de ejercicio fsico que realiza mediante actividades como la jardinera, Gaffer a Advertising account planner o usar las escaleras. No consuma ningn producto que contenga nicotina o tabaco. Estos productos incluyen cigarrillos, tabaco para Theatre manager y aparatos de vapeo, como los Administrator, Civil Service. Si necesita ayuda para dejar de consumir estos productos, consulte al American [employer]. Indicaciones generales Use los medicamentos de venta libre y los recetados solamente como se lo haya indicado el mdico. Oceanographer a todas las visitas de seguimiento. Esto es importante. Dnde buscar ms informacin American Heart Association (Asociacin Estadounidense del Corazn): www.heart.org National Heart, Lung, and Blood Institute ([employer] del [place], los Pulmones y Risk manager): PopSteam.is Comunquese con un mdico si: Tiene dificultad para alcanzar o mantener una alimentacin sana y un peso saludable. Est por comenzar un programa de ejercicios. No puede dejar de fumar. Solicite ayuda  de inmediato si: Midwife. Tiene dificultad para respirar. Tiene molestias o dolor en la [place], el cuello, la espalda, los hombros o [place]. Tiene sntomas de un accidente cerebrovascular. "BE FAST" es una manera fcil de recordar los principales signos de advertencia de un accidente cerebrovascular: B: Balance (equilibrio). Los signos son mareos, dificultad repentina para caminar o prdida del equilibrio. E - Eyes (ojos). Los signos son problemas para ver o un cambio repentino en la visin. F: Face (rostro). Los signos son debilidad repentina o entumecimiento del rostro, o el rostro o el prpado que se caen hacia un lado. A: Arms (brazos). Los signos son debilidad o entumecimiento en un brazo. Esto sucede de repente y generalmente en un lado del cuerpo. S: Speech (habla). Los signos son dificultad para hablar, hablar arrastrando las palabras o dificultad para comprender lo que las Forensic scientist. T: Time (tiempo). Es tiempo de llamar al servicio de Sports administrator. Anote la hora a la que Alber[employer]on's sntomas. Presenta otros signos de un accidente cerebrovascular, como los siguientes: Dolor de cabeza sbito e intenso que no tiene causa aparente. Nuseas o vmitos. Convulsiones. Estos sntomas pueden representar un problema grave que constituye Radio broadcast assistant. No espere a ver si los sntomas desaparecen. Solicite atencin mdica de inmediato. Comunquese con el servicio de emergencias de su localidad (911 en los [place]). No conduzca por sus propios medios [employer]. Resumen Las placas de colesterol aumentan el riesgo de sufrir un infarto de miocardio y un accidente cerebrovascular. Trabaje con el mdico  para Good[employer] Corporation de colesterol en un rango saludable. Siga una dieta saludable y equilibrada, haga ejercicio con regularidad y Shan Levans un peso saludable. No consuma ningn producto que contenga nicotina o tabaco. Estos productos incluyen  cigarrillos, tabaco para Theatre manager y aparatos de vapeo, como los Administrator, Civil Service. Obtenga ayuda de inmediato si tiene cualquier sntoma de un accidente cerebrovascular. Esta informacin no tiene Theme park manager el consejo del mdico. Asegrese de hacerle al mdico cualquier pregunta que tenga. Document Revised: 03/20/2020 Document Reviewed: 03/20/2020 Elsevier Patient Education  2024 [employer].

## 2023-02-20 ENCOUNTER — Other Ambulatory Visit: Payer: Self-pay

## 2023-02-20 ENCOUNTER — Other Ambulatory Visit: Payer: Self-pay | Admitting: Physician Assistant

## 2023-02-20 DIAGNOSIS — E018 Other iodine-deficiency related thyroid disorders and allied conditions: Secondary | ICD-10-CM

## 2023-02-20 LAB — CMP14+EGFR
ALT: 30 [IU]/L (ref 0–44)
AST: 28 [IU]/L (ref 0–40)
Albumin: 4.2 g/dL (ref 4.1–5.1)
Alkaline Phosphatase: 78 [IU]/L (ref 44–121)
BUN/Creatinine Ratio: 15 (ref 9–20)
BUN: 12 mg/dL (ref 6–24)
Bilirubin Total: 0.3 mg/dL (ref 0.0–1.2)
CO2: 24 mmol/L (ref 20–29)
Calcium: 9.1 mg/dL (ref 8.7–10.2)
Chloride: 102 mmol/L (ref 96–106)
Creatinine, Ser: 0.81 mg/dL (ref 0.76–1.27)
Globulin, Total: 2.4 g/dL (ref 1.5–4.5)
Glucose: 85 mg/dL (ref 70–99)
Potassium: 4.3 mmol/L (ref 3.5–5.2)
Sodium: 139 mmol/L (ref 134–144)
Total Protein: 6.6 g/dL (ref 6.0–8.5)
eGFR: 110 mL/min/{1.73_m2} (ref >59)

## 2023-02-20 LAB — THYROID PANEL WITH TSH
Free Thyroxine Index: 2.9 (ref 1.2–4.9)
T3 Uptake Ratio: 28 % (ref 24–39)
T4, Total: 10.2 ug/dL (ref 4.5–12.0)
TSH: 2.94 u[IU]/mL (ref 0.450–4.500)

## 2023-02-20 LAB — LIPID PANEL
Chol/HDL Ratio: 4.2 {ratio} (ref 0.0–5.0)
Cholesterol, Total: 197 mg/dL (ref 100–199)
HDL: 47 mg/dL (ref >39)
LDL Chol Calc (NIH): 127 mg/dL — ABNORMAL HIGH (ref 0–99)
Triglycerides: 128 mg/dL (ref 0–149)
VLDL Cholesterol Cal: 23 mg/dL (ref 5–40)

## 2023-02-20 MED ORDER — LEVOTHYROXINE SODIUM 125 MCG PO TABS
125.0000 ug | ORAL_TABLET | Freq: Every day | ORAL | 1 refills | Status: DC
  Filled 2023-02-20: qty 90, 90d supply, fill #0
  Filled 2023-05-23: qty 30, 30d supply, fill #1
  Filled 2023-06-25 – 2023-06-30 (×2): qty 30, 30d supply, fill #2

## 2023-02-24 ENCOUNTER — Telehealth: Payer: Self-pay

## 2023-02-24 NOTE — Telephone Encounter (Signed)
 Attn: Dalene Carrow   Gave patient results, verbatim. No questions.  He said thank you.    Anders Simmonds, PA-C 02/20/2023  8:13 AM EST     Thyroid is perfect at current dose of levothyroxine.  I sen you a new prescription (90 day supply) with refill. Your overall cholesterol is better but your LDL "bad" cholesterol is still about the same.  Continue working on less fried/greasy/sugary foods.  Kidney, liver, electrolyte levels are normal.  Thanks, Georgian Co, PA-C

## 2023-02-24 NOTE — Telephone Encounter (Signed)
-----   Message from Dulce Gibbs sent at 02/20/2023  8:13 AM EST ----- Thyroid  is perfect at current dose of levothyroxine .  I sen you a new prescription (90 day supply) with refill. Your overall cholesterol is better but your LDL "bad" cholesterol is still about the same.  Continue working on less fried/greasy/sugary foods.  Kidney, liver, electrolyte levels are normal.  Thanks, Dulce Gibbs, PA-C

## 2023-02-24 NOTE — Telephone Encounter (Signed)
 Noted.

## 2023-02-24 NOTE — Telephone Encounter (Signed)
 Pt was called and vm was left, Information has been sent to nurse pool.   Interpreter id # [id number]

## 2023-02-25 ENCOUNTER — Other Ambulatory Visit: Payer: Self-pay

## 2023-05-23 ENCOUNTER — Other Ambulatory Visit: Payer: Self-pay

## 2023-05-26 ENCOUNTER — Other Ambulatory Visit: Payer: Self-pay

## 2023-06-25 ENCOUNTER — Other Ambulatory Visit: Payer: Self-pay

## 2023-06-26 ENCOUNTER — Other Ambulatory Visit: Payer: Self-pay

## 2023-06-27 ENCOUNTER — Other Ambulatory Visit: Payer: Self-pay

## 2023-06-30 ENCOUNTER — Other Ambulatory Visit: Payer: Self-pay

## 2023-07-01 ENCOUNTER — Other Ambulatory Visit: Payer: Self-pay

## 2023-07-01 ENCOUNTER — Telehealth: Payer: Self-pay | Admitting: Pharmacist

## 2023-07-01 ENCOUNTER — Other Ambulatory Visit: Payer: Self-pay | Admitting: Pharmacist

## 2023-07-01 DIAGNOSIS — E018 Other iodine-deficiency related thyroid disorders and allied conditions: Secondary | ICD-10-CM

## 2023-07-01 MED ORDER — LEVOTHYROXINE SODIUM 125 MCG PO TABS
125.0000 ug | ORAL_TABLET | Freq: Every day | ORAL | 0 refills | Status: DC
  Filled 2023-07-01: qty 30, 30d supply, fill #0
  Filled 2023-07-17 – 2023-07-23 (×2): qty 30, 30d supply, fill #1
  Filled 2023-08-21: qty 30, 30d supply, fill #2

## 2023-07-01 NOTE — Telephone Encounter (Signed)
 Received notice from pharmacy that his current NDC/manufacturer of levothyroxine  is on backorder. They are having to switch to a new NDC. Pharmacy has placed a consult and patient is aware that he will need to return for TSH/thyroid  labs in 4-6 weeks. His levels have been stable on 125 mcg for several years. However, I signed a future order for TSH and am routing to patient's PCP so she is aware.

## 2023-07-17 ENCOUNTER — Other Ambulatory Visit: Payer: Self-pay

## 2023-07-23 ENCOUNTER — Other Ambulatory Visit: Payer: Self-pay

## 2023-08-19 ENCOUNTER — Ambulatory Visit: Payer: Self-pay | Admitting: Internal Medicine

## 2023-08-21 ENCOUNTER — Other Ambulatory Visit: Payer: Self-pay

## 2023-09-12 ENCOUNTER — Telehealth: Payer: Self-pay | Admitting: Internal Medicine

## 2023-09-12 NOTE — Telephone Encounter (Signed)
 Called patient to confirm upcoming appointment 09/16/2023, no answer. Unable to leave VM.

## 2023-09-16 ENCOUNTER — Ambulatory Visit: Payer: Self-pay | Attending: Internal Medicine | Admitting: Internal Medicine

## 2023-09-16 ENCOUNTER — Other Ambulatory Visit: Payer: Self-pay

## 2023-09-16 ENCOUNTER — Encounter: Payer: Self-pay | Admitting: Internal Medicine

## 2023-09-16 VITALS — BP 134/83 | HR 58 | Temp 98.2°F | Ht 68.0 in | Wt 214.0 lb

## 2023-09-16 DIAGNOSIS — Z87891 Personal history of nicotine dependence: Secondary | ICD-10-CM

## 2023-09-16 DIAGNOSIS — R030 Elevated blood-pressure reading, without diagnosis of hypertension: Secondary | ICD-10-CM

## 2023-09-16 DIAGNOSIS — E018 Other iodine-deficiency related thyroid disorders and allied conditions: Secondary | ICD-10-CM

## 2023-09-16 DIAGNOSIS — Z79890 Hormone replacement therapy (postmenopausal): Secondary | ICD-10-CM

## 2023-09-16 DIAGNOSIS — E782 Mixed hyperlipidemia: Secondary | ICD-10-CM

## 2023-09-16 DIAGNOSIS — H11001 Unspecified pterygium of right eye: Secondary | ICD-10-CM

## 2023-09-16 DIAGNOSIS — Z2821 Immunization not carried out because of patient refusal: Secondary | ICD-10-CM

## 2023-09-16 DIAGNOSIS — R0683 Snoring: Secondary | ICD-10-CM

## 2023-09-16 MED ORDER — LEVOTHYROXINE SODIUM 125 MCG PO TABS
125.0000 ug | ORAL_TABLET | Freq: Every day | ORAL | 1 refills | Status: DC
  Filled 2023-09-16: qty 90, 90d supply, fill #0

## 2023-09-16 NOTE — Patient Instructions (Signed)
 VISIT SUMMARY:  Today, you came in for a follow-up visit and requested a referral to an eye specialist. We discussed your thyroid  condition, eye symptoms, blood pressure, cholesterol levels, and snoring. Your thyroid  levels were normal in February, and we will check them again. You also received a refill for your thyroid  medication. We addressed your eye symptoms and provided a referral to an ophthalmologist. We discussed your slightly elevated blood pressure and cholesterol levels, and I provided dietary recommendations. Lastly, we talked about your snoring and potential next steps.  YOUR PLAN:  -THYROID  DISORDER ON LEVOTHYROXINE  THERAPY: Your thyroid  levels were normal in February, and we will check them again to ensure they remain stable. You will continue taking levothyroxine , and I have refilled your prescription for a 90-day supply.  -RIGHT EYE PTERYGIUM: A pterygium is a growth on the eye that can cause symptoms like pressure, dryness, and redness. I have referred you to an ophthalmologist for further evaluation. In the meantime, please wear sunglasses in the sun and protective eyewear in windy conditions.  -ELEVATED BLOOD PRESSURE WITHOUT DIAGNOSIS OF HYPERTENSION: Your blood pressure was slightly elevated today. While you do not have a diagnosis of hypertension, it is important to monitor it. I recommend reducing your salt intake, eating more fresh fruits and vegetables, and checking your blood pressure weekly at a local pharmacy. We will recheck your blood pressure in 6 weeks.  -MIXED HYPERLIPIDEMIA: Your cholesterol levels have improved since February, but it is important to continue making healthy choices. Avoid fast and fried foods, cook with healthy oils like olive oil, and eat plenty of fresh fruits and vegetables.  -SNORING: You have been experiencing loud snoring, but no observed apneas or daytime sleepiness. Snoring can sometimes be a sign of sleep apnea. I suggest applying for a  discount or Medicaid for a potential sleep study. If you obtain insurance or a discount, we can refer you for a sleep study.  INSTRUCTIONS:  Please follow up in 6 weeks to recheck your blood pressure. In the meantime, monitor your blood pressure weekly at a local pharmacy and record the readings. If you obtain insurance or a discount, let us  know so we can refer you for a sleep study.

## 2023-09-16 NOTE — Progress Notes (Signed)
 Patient ID: Daniel Fuller, male    DOB: February 08, 1975  MRN: [id number]  CC: Hypothyroidism (Hypothyroidism f/u. Med refill. /Experiencing snoring at night per wife /Requesting referral to ophthalmologist - intermittent redness /Joint pain during cold weather - requesting med /Requesting blood work - lipid /Fungal cream for toes/No to flu vax)   Subjective: Daniel Fuller is a 48 y.o. male who presents for chronic ds management. His concerns today include:  Patient with history of GERD, chronic constipation, rectal itching, hypothyroidism, HL   Discussed the use of AI scribe software for clinical note transcription with the patient, who gave verbal consent to proceed.  History of Present Illness Daniel Fuller is a 48 year old male who presents for a follow-up visit.  He is taking levothyroxine  125 micrograms daily for hypothyroidism. His last thyroid  level check in February 2025 was normal. He requests a refill for a 90-day supply of his medication.  He experiences eye symptoms, including a sensation of pressure, dryness, and redness, particularly when exposed to sunlight. Symptoms more so in RT eye. He sometimes forgets to wear sunglasses, especially when sweating, which makes it difficult to see. Diagnosed with pterygium of the right eye a few years ago.  He is requesting referral to ophthalmology.  He is uninsured.   His wife has noted that he snores loudly at night, but she has not observed any episodes of apnea. He sometimes feels tired in the morning but does not experience morning headaches or daytime sleepiness. He notices that he does not snore when at higher altitudes.    Wonders about having cholesterol check. In February 2025, his total cholesterol was 197 mg/dL, down from 792 mg/dL, and his LDL cholesterol was 127 mg/dL, down from 870 mg/dL. He consumes some fast and fried foods.  He had mentioned to my nurse about needing cream for fungal  infection on his feet.  However, he tells me that he purchased an over-the-counter antifungal cream and is using it and found it to be effective.    Blood pressure noted to be elevated today and on last visit.  He admits that he uses more salt in his food than he should.    Patient Active Problem List   Diagnosis Date Noted   Influenza vaccine refused 02/21/2020   Pterygium of right eye 02/21/2020   Constipation 02/21/2020   Bradycardia 02/21/2020   Gastroesophageal reflux disease without esophagitis 01/13/2018   Positive depression screening 01/13/2018   Obesity (BMI 30-39.9) 01/13/2018   Bacterial infection due to H. pylori 05/27/2016   Epigastric pain 04/15/2016   HYPOTHYROIDISM, POST-RADIOACTIVE IODINE 01/05/2007     Current Outpatient Medications on File Prior to Visit  Medication Sig Dispense Refill   levothyroxine  (SYNTHROID ) 125 MCG tablet Take 1 tablet (125 mcg total) by mouth daily. 90 tablet 0   No current facility-administered medications on file prior to visit.    Allergies  Allergen Reactions   Omeprazole      headache    Social History   Socioeconomic History   Marital status: Married    Spouse name: Not on file   Number of children: Not on file   Years of education: Not on file   Highest education level: Not on file  Occupational History   Not on file  Tobacco Use   Smoking status: Former    Current packs/day: 0.00    Average packs/day: 0.4 packs/day for 6.0 years (2.4 ttl pk-yrs)    Types: Cigarettes    Start date:  08/06/1994    Quit date: 08/05/2000    Years since quitting: 23.1   Smokeless tobacco: Current  Substance and Sexual Activity   Alcohol use: Yes   Drug use: No   Sexual activity: Yes  Other Topics Concern   Not on file  Social History Narrative   Not on file   Social Drivers of Health   Financial Resource Strain: Not on file  Food Insecurity: Food Insecurity Present (02/19/2023)   Hunger Vital Sign    Worried About Running Out  of Food in the Last Year: Sometimes true    Ran Out of Food in the Last Year: Never true  Transportation Needs: Not on file  Physical Activity: Sufficiently Active (02/19/2023)   Exercise Vital Sign    Days of Exercise per Week: 3 days    Minutes of Exercise per Session: 60 min  Stress: No Stress Concern Present (02/19/2023)   [employer] of Occupational Health - Occupational Stress Questionnaire    Feeling of Stress : Not at all  Social Connections: Socially Integrated (02/19/2023)   Social Connection and Isolation Panel    Frequency of Communication with Friends and Family: More than three times a week    Frequency of Social Gatherings with Friends and Family: More than three times a week    Attends Religious Services: 1 to 4 times per year    Active Member of [employer] or Organizations: Yes    Attends Banker Meetings: 1 to 4 times per year    Marital Status: Married  Catering manager Violence: Not At Risk (02/19/2023)   Humiliation, Afraid, Rape, and Kick questionnaire    Fear of Current or Ex-Partner: No    Emotionally Abused: No    Physically Abused: No    Sexually Abused: No    No family history on file.  No past surgical history on file.  ROS: Review of Systems Negative except as stated above  PHYSICAL EXAM: BP 134/83 (BP Location: Left Arm, Patient Position: Sitting, Cuff Size: Normal)   Pulse (!) 58   Temp 98.2 F (36.8 C) (Oral)   Ht 5' 8 (1.727 m)   Wt 214 lb (97.1 kg)   SpO2 97%   BMI 32.54 kg/m   Physical Exam   General appearance - alert, well appearing, and in no distress Mental status - normal mood, behavior, speech, dress, motor activity, and thought processes Eyes -patient has a red fleshy growth on the medial aspect of the right cornea extending towards the iris Neck - supple, no significant adenopathy Chest - clear to auscultation, no wheezes, rales or rhonchi, symmetric air entry Heart - normal rate, regular rhythm, normal S1, S2, no  murmurs, rubs, clicks or gallops     Latest Ref Rng & Units 02/19/2023   10:17 AM 04/09/2022    4:32 PM 05/30/2021    4:30 PM  CMP  Glucose 70 - 99 mg/dL 85   82   BUN 6 - 24 mg/dL 12   17   Creatinine 9.23 - 1.27 mg/dL 9.18   9.13   Sodium 865 - 144 mmol/L 139   142   Potassium 3.5 - 5.2 mmol/L 4.3   4.3   Chloride 96 - 106 mmol/L 102   104   CO2 20 - 29 mmol/L 24   25   Calcium  8.7 - 10.2 mg/dL 9.1   9.2   Total Protein 6.0 - 8.5 g/dL 6.6  6.9  7.1   Total Bilirubin 0.0 -  1.2 mg/dL 0.3  0.2  0.2   Alkaline Phos 44 - 121 IU/L 78  84  86   AST 0 - 40 IU/L 28  31  39   ALT 0 - 44 IU/L 30  29  39    Lipid Panel     Component Value Date/Time   CHOL 197 02/19/2023 1017   TRIG 128 02/19/2023 1017   HDL 47 02/19/2023 1017   CHOLHDL 4.2 02/19/2023 1017   CHOLHDL 4.2 03/19/2016 0917   VLDL 21 03/19/2016 0917   LDLCALC 127 (H) 02/19/2023 1017    CBC    Component Value Date/Time   WBC 7.9 02/21/2020 1150   WBC 6.9 03/19/2016 0917   RBC 5.36 02/21/2020 1150   RBC 5.14 03/19/2016 0917   HGB 15.1 02/21/2020 1150   HCT 44.1 02/21/2020 1150   PLT 306 02/21/2020 1150   MCV 82 02/21/2020 1150   MCH 28.2 02/21/2020 1150   MCH 28.6 03/19/2016 0917   MCHC 34.2 02/21/2020 1150   MCHC 34.3 03/19/2016 0917   RDW 11.6 02/21/2020 1150   LYMPHSABS 1,794 03/19/2016 0917   MONOABS 414 03/19/2016 0917   EOSABS 69 03/19/2016 0917   BASOSABS 0 03/19/2016 0917    ASSESSMENT AND PLAN: 1. HYPOTHYROIDISM, POST-RADIOACTIVE IODINE (Primary) Continue Synthroid . - levothyroxine  (SYNTHROID ) 125 MCG tablet; Take 1 tablet (125 mcg total) by mouth daily.  Dispense: 90 tablet; Refill: 1 - TSH; Future  2. Loud snoring Plan for sleep study once he has applied for and approved for Medicaid or [facility]  3. Mixed hyperlipidemia Went over last lipid profile with him.  Dietary counseling given.  Avoid fried foods and greasy foods.  Cook with healthy oils like olive oil.  4. Pterygium of right  eye Recommend wearing shades when exposed to prolonged periods of sunlight or with the conditions.  Recommend that he makes an appointment to see an eye doctor.  He is not insured.  5. Elevated blood pressure reading in office without diagnosis of hypertension DASH discussed and encourage. No access to BP device. F/U in 4-6 wks for recheck  6. Influenza vaccination declined     Patient was given the opportunity to ask questions.  Patient verbalized understanding of the plan and was able to repeat key elements of the plan.   This documentation was completed using Paediatric nurse.  Any transcriptional errors are unintentional.  No orders of the defined types were placed in this encounter.    Requested Prescriptions   Pending Prescriptions Disp Refills   levothyroxine  (SYNTHROID ) 125 MCG tablet 90 tablet 1    Sig: Take 1 tablet (125 mcg total) by mouth daily.    No follow-ups on file.  Barnie Louder, MD, FACP

## 2023-09-18 ENCOUNTER — Ambulatory Visit: Payer: Self-pay | Attending: Family Medicine

## 2023-09-18 DIAGNOSIS — E018 Other iodine-deficiency related thyroid disorders and allied conditions: Secondary | ICD-10-CM

## 2023-09-19 ENCOUNTER — Ambulatory Visit: Payer: Self-pay | Admitting: Internal Medicine

## 2023-09-19 DIAGNOSIS — E018 Other iodine-deficiency related thyroid disorders and allied conditions: Secondary | ICD-10-CM

## 2023-09-19 LAB — THYROID PANEL WITH TSH
Free Thyroxine Index: 2.3 (ref 1.2–4.9)
T3 Uptake Ratio: 26 % (ref 24–39)
T4, Total: 8.9 ug/dL (ref 4.5–12.0)
TSH: 5.9 u[IU]/mL — ABNORMAL HIGH (ref 0.450–4.500)

## 2023-09-22 ENCOUNTER — Other Ambulatory Visit: Payer: Self-pay

## 2023-10-23 ENCOUNTER — Other Ambulatory Visit: Payer: Self-pay

## 2023-10-30 ENCOUNTER — Ambulatory Visit: Payer: Self-pay

## 2023-10-30 ENCOUNTER — Ambulatory Visit: Payer: Self-pay | Attending: Internal Medicine | Admitting: Internal Medicine

## 2023-10-30 VITALS — BP 132/79 | HR 50 | Temp 98.4°F | Ht 68.0 in | Wt 213.0 lb

## 2023-10-30 DIAGNOSIS — E039 Hypothyroidism, unspecified: Secondary | ICD-10-CM

## 2023-10-30 DIAGNOSIS — R030 Elevated blood-pressure reading, without diagnosis of hypertension: Secondary | ICD-10-CM

## 2023-10-30 NOTE — Progress Notes (Signed)
 Patient ID: Daniel Fuller, male    DOB: 1975/11/13  MRN: [id number]  CC: Follow-up (BP recheck/No questions / concerns/No to flu vax.)   Subjective: Daniel Fuller is a 48 y.o. male who presents for chronic ds management. His concerns today include:  Patient with history of GERD, chronic constipation, rectal itching, hypothyroidism, HL   Discussed the use of AI scribe software for clinical note transcription with the patient, who gave verbal consent to proceed.  History of Present Illness Daniel Fuller is a 48 year old male who presents for a recheck of elevated blood pressure.  He is here for a follow-up on his blood pressure, which was elevated at the last visit with a reading of 134/89 mmHg. Today, his initial reading was 132/79 mmHg, and a subsequent reading was 131/85 mmHg. He has not been able to check his blood pressure at home since the last visit and is not currently using any blood pressure monitoring device.  He has been limiting his salt intake 'a little bit'. He mentioned a family gathering prior to his last appointment where he consumed alcohol, which he speculates might have affected his blood pressure.  He has not checked his eligibility for Medicaid, which is relevant for a sleep study discussed previously.  Also came to have TSH recheck as last level was slightly elevated at 5.9. Reports taking the Levothyroxine  as prescribed.    Patient Active Problem List   Diagnosis Date Noted   Influenza vaccine refused 02/21/2020   Pterygium of right eye 02/21/2020   Constipation 02/21/2020   Bradycardia 02/21/2020   Gastroesophageal reflux disease without esophagitis 01/13/2018   Positive depression screening 01/13/2018   Obesity (BMI 30-39.9) 01/13/2018   Bacterial infection due to H. pylori 05/27/2016   Epigastric pain 04/15/2016   HYPOTHYROIDISM, POST-RADIOACTIVE IODINE 01/05/2007     Current Outpatient Medications on File Prior  to Visit  Medication Sig Dispense Refill   levothyroxine  (SYNTHROID ) 125 MCG tablet Take 1 tablet (125 mcg total) by mouth daily. 90 tablet 1   No current facility-administered medications on file prior to visit.    Allergies  Allergen Reactions   Omeprazole      headache    Social History   Socioeconomic History   Marital status: Married    Spouse name: Not on file   Number of children: Not on file   Years of education: Not on file   Highest education level: Not on file  Occupational History   Not on file  Tobacco Use   Smoking status: Former    Current packs/day: 0.00    Average packs/day: 0.4 packs/day for 6.0 years (2.4 ttl pk-yrs)    Types: Cigarettes    Start date: 08/06/1994    Quit date: 08/05/2000    Years since quitting: 23.2   Smokeless tobacco: Current  Substance and Sexual Activity   Alcohol use: Yes   Drug use: No   Sexual activity: Yes  Other Topics Concern   Not on file  Social History Narrative   Not on file   Social Drivers of Health   Financial Resource Strain: Low Risk  (10/30/2023)   Overall Financial Resource Strain (CARDIA)    Difficulty of Paying Living Expenses: Not very hard  Food Insecurity: No Food Insecurity (10/30/2023)   Hunger Vital Sign    Worried About Running Out of Food in the Last Year: Never true    Ran Out of Food in the Last Year: Never true  Transportation Needs: No  Transportation Needs (10/30/2023)   PRAPARE - Administrator, Civil Service (Medical): No    Lack of Transportation (Non-Medical): No  Physical Activity: Sufficiently Active (10/30/2023)   Exercise Vital Sign    Days of Exercise per Week: 3 days    Minutes of Exercise per Session: 60 min  Stress: No Stress Concern Present (10/30/2023)   [employer] of Occupational Health - Occupational Stress Questionnaire    Feeling of Stress: Not at all  Social Connections: Socially Integrated (10/30/2023)   Social Connection and Isolation Panel     Frequency of Communication with Friends and Family: More than three times a week    Frequency of Social Gatherings with Friends and Family: More than three times a week    Attends Religious Services: More than 4 times per year    Active Member of [employer] or Organizations: Yes    Attends Banker Meetings: More than 4 times per year    Marital Status: Married  Catering manager Violence: Not At Risk (10/30/2023)   Humiliation, Afraid, Rape, and Kick questionnaire    Fear of Current or Ex-Partner: No    Emotionally Abused: No    Physically Abused: No    Sexually Abused: No    No family history on file.  No past surgical history on file.  ROS: Review of Systems Negative except as stated above  PHYSICAL EXAM: BP 132/79 (BP Location: Left Arm, Patient Position: Sitting, Cuff Size: Normal)   Pulse (!) 50   Temp 98.4 F (36.9 C) (Oral)   Ht 5' 8 (1.727 m)   Wt 213 lb (96.6 kg)   SpO2 99%   BMI 32.39 kg/m   Physical Exam   General appearance - alert, well appearing, and in no distress Mental status - normal mood, behavior, speech, dress, motor activity, and thought processes Chest - clear to auscultation, no wheezes, rales or rhonchi, symmetric air entry Heart - normal rate, regular rhythm, normal S1, S2, no murmurs, rubs, clicks or gallops     Latest Ref Rng & Units 02/19/2023   10:17 AM 04/09/2022    4:32 PM 05/30/2021    4:30 PM  CMP  Glucose 70 - 99 mg/dL 85   82   BUN 6 - 24 mg/dL 12   17   Creatinine 9.23 - 1.27 mg/dL 9.18   9.13   Sodium 865 - 144 mmol/L 139   142   Potassium 3.5 - 5.2 mmol/L 4.3   4.3   Chloride 96 - 106 mmol/L 102   104   CO2 20 - 29 mmol/L 24   25   Calcium  8.7 - 10.2 mg/dL 9.1   9.2   Total Protein 6.0 - 8.5 g/dL 6.6  6.9  7.1   Total Bilirubin 0.0 - 1.2 mg/dL 0.3  0.2  0.2   Alkaline Phos 44 - 121 IU/L 78  84  86   AST 0 - 40 IU/L 28  31  39   ALT 0 - 44 IU/L 30  29  39    Lipid Panel     Component Value Date/Time   CHOL 197  02/19/2023 1017   TRIG 128 02/19/2023 1017   HDL 47 02/19/2023 1017   CHOLHDL 4.2 02/19/2023 1017   CHOLHDL 4.2 03/19/2016 0917   VLDL 21 03/19/2016 0917   LDLCALC 127 (H) 02/19/2023 1017    CBC    Component Value Date/Time   WBC 7.9 02/21/2020 1150   WBC  6.9 03/19/2016 0917   RBC 5.36 02/21/2020 1150   RBC 5.14 03/19/2016 0917   HGB 15.1 02/21/2020 1150   HCT 44.1 02/21/2020 1150   PLT 306 02/21/2020 1150   MCV 82 02/21/2020 1150   MCH 28.2 02/21/2020 1150   MCH 28.6 03/19/2016 0917   MCHC 34.2 02/21/2020 1150   MCHC 34.3 03/19/2016 0917   RDW 11.6 02/21/2020 1150   LYMPHSABS 1,794 03/19/2016 0917   MONOABS 414 03/19/2016 0917   EOSABS 69 03/19/2016 0917   BASOSABS 0 03/19/2016 0917    ASSESSMENT AND PLAN:  Assessment and Plan Assessment & Plan Elevated blood pressure Blood pressure improved to 132/79 but remains slightly above target. - Advise limiting salt intake and avoiding excessive alcohol. - Recommend purchasing an arm cuff blood pressure monitor and checking twice weekly. - Instruct on proper home blood pressure measurement technique. - Plan to recheck blood pressure at next visit. - Encourage regular exercise.  Hypothyroidism -recheck TSH today. Continue current Levothyroxine  dose until results are back   Patient was given the opportunity to ask questions.  Patient verbalized understanding of the plan and was able to repeat key elements of the plan.   This documentation was completed using Paediatric nurse.  Any transcriptional errors are unintentional.  No orders of the defined types were placed in this encounter.    Requested Prescriptions    No prescriptions requested or ordered in this encounter    No follow-ups on file.  Barnie Louder, MD, FACP

## 2023-10-30 NOTE — Patient Instructions (Signed)
  VISIT SUMMARY: Today, we reviewed your blood pressure, which has improved but is still slightly above the target range. We also discussed general health maintenance, including the importance of regular exercise.  YOUR PLAN: -ELEVATED BLOOD PRESSURE: Your blood pressure has improved to 132/79 but is still slightly above the target range. Elevated blood pressure means that the force of the blood against your artery walls is too high, which can lead to health problems. You should limit your salt intake and avoid excessive alcohol. It is recommended that you purchase an arm cuff blood pressure monitor and check your blood pressure twice a week. Make sure to follow the proper technique for measuring your blood pressure at home. We will recheck your blood pressure at your next visit. If blood pressure readings are staying below 130/80, you are doing good.  -GENERAL HEALTH MAINTENANCE: Regular exercise is important for your overall health and can help manage your blood pressure. You are encouraged to engage in regular physical activity.  INSTRUCTIONS: Please purchase an arm cuff blood pressure monitor and check your blood pressure twice a week. Follow the proper technique for home blood pressure measurement. We will recheck your blood pressure at your next visit.                      Contains text generated by [employer].                                 Contains text generated by [employer].

## 2023-10-31 ENCOUNTER — Other Ambulatory Visit: Payer: Self-pay

## 2023-10-31 ENCOUNTER — Ambulatory Visit: Payer: Self-pay | Admitting: Internal Medicine

## 2023-10-31 ENCOUNTER — Other Ambulatory Visit: Payer: Self-pay | Admitting: Internal Medicine

## 2023-10-31 DIAGNOSIS — E018 Other iodine-deficiency related thyroid disorders and allied conditions: Secondary | ICD-10-CM

## 2023-10-31 LAB — TSH: TSH: 7.31 u[IU]/mL — ABNORMAL HIGH (ref 0.450–4.500)

## 2023-10-31 MED ORDER — LEVOTHYROXINE SODIUM 150 MCG PO TABS
150.0000 ug | ORAL_TABLET | Freq: Every day | ORAL | 3 refills | Status: DC
  Filled 2023-10-31: qty 30, 30d supply, fill #0
  Filled 2023-12-02 – 2023-12-05 (×2): qty 30, 30d supply, fill #1

## 2023-11-04 ENCOUNTER — Ambulatory Visit: Payer: Self-pay

## 2023-11-04 NOTE — Telephone Encounter (Signed)
 FYI Only or Action Required?: FYI only for provider.  Patient was last seen in primary care on 10/30/2023 by Daniel Barnie NOVAK, MD.  Called Nurse Triage reporting Results.  Symptoms began no symptoms.  Interventions attempted: Nothing.  Symptoms are: stable.  Triage Disposition: Information or Advice Only Call  Patient/caregiver understands and will follow disposition?: Yes Lab Results provided  Copied from CRM [id number]. Topic: Clinical - Lab/Test Results >> Nov 04, 2023 12:18 PM Kevelyn M wrote: Reason for CRM: Calling in for lab results. Clarisa in office is not available. Reason for Disposition  [1] Follow-up call to recent contact AND [2] information only call, no triage required  Answer Assessment - Initial Assessment Questions 1. REASON FOR CALL: What is the main reason for your call? or How can I best help you?     Lab results 2. SYMPTOMS : Do you have any symptoms?      No symptoms 3. OTHER QUESTIONS: Do you have any other questions?     no  Protocols used: Information Only Call - No Triage-A-AH

## 2023-11-06 ENCOUNTER — Other Ambulatory Visit: Payer: Self-pay

## 2023-12-02 ENCOUNTER — Other Ambulatory Visit: Payer: Self-pay

## 2023-12-05 ENCOUNTER — Other Ambulatory Visit: Payer: Self-pay

## 2023-12-08 ENCOUNTER — Other Ambulatory Visit: Payer: Self-pay | Admitting: Internal Medicine

## 2023-12-08 DIAGNOSIS — E018 Other iodine-deficiency related thyroid disorders and allied conditions: Secondary | ICD-10-CM

## 2023-12-08 NOTE — Telephone Encounter (Unsigned)
 Copied from CRM [id number]. Topic: Clinical - Medication Refill >> Dec 08, 2023  2:48 PM Berwyn MATSU wrote: Medication: levothyroxine  (SYNTHROID ) 150 MCG tablet; patient is requesting a 90 day supply as he will be away for 2 months    Has the patient contacted their pharmacy? Yes (Agent: If no, request that the patient contact the pharmacy for the refill. If patient does not wish to contact the pharmacy document the reason why and proceed with request.) (Agent: If yes, when and what did the pharmacy advise?)  This is the patient's preferred pharmacy:  [facility] [address] [place] [address] Phone: [phone] Fax: [phone]  Is this the correct pharmacy for this prescription? Yes If no, delete pharmacy and type the correct one.   Has the prescription been filled recently? Yes  Is the patient out of the medication? No  Has the patient been seen for an appointment in the last year OR does the patient have an upcoming appointment? Yes  Can we respond through MyChart? No  Agent: Please be advised that Rx refills may take up to 3 business days. We ask that you follow-up with your pharmacy.

## 2023-12-09 ENCOUNTER — Other Ambulatory Visit: Payer: Self-pay

## 2023-12-09 MED ORDER — LEVOTHYROXINE SODIUM 150 MCG PO TABS
150.0000 ug | ORAL_TABLET | Freq: Every day | ORAL | 2 refills | Status: DC
  Filled 2023-12-09: qty 30, 30d supply, fill #0

## 2023-12-09 NOTE — Telephone Encounter (Signed)
 Receipt not confirmed by pharmacy will resend Requested Prescriptions  Pending Prescriptions Disp Refills   levothyroxine  (SYNTHROID ) 150 MCG tablet 30 tablet 3    Sig: Take 1 tablet (150 mcg total) by mouth daily.     Endocrinology:  Hypothyroid Agents Failed - 12/09/2023  3:16 PM      Failed - TSH in normal range and within 360 days    TSH  Date Value Ref Range Status  10/30/2023 7.310 (H) 0.450 - 4.500 uIU/mL Final         Passed - Valid encounter within last 12 months    Recent Outpatient Visits           1 month ago Elevated blood pressure reading in office without diagnosis of hypertension   [facility] [place]. [facility] [place] Barnie NOVAK, MD   2 months ago HYPOTHYROIDISM, POST-RADIOACTIVE IODINE   [facility] [place]. [facility] Barnie NOVAK, MD   9 months ago HYPOTHYROIDISM, POST-RADIOACTIVE IODINE   [facility] Of [place]. [facility] [place], Jon HERO, [place]   1 year ago HYPOTHYROIDISM, POST-RADIOACTIVE IODINE   [facility] Of [place]. [facility] Barnie NOVAK, MD   2 years ago HYPOTHYROIDISM, POST-RADIOACTIVE IODINE   [facility] Of [place]. [facility] [place], [place], PA-C

## 2023-12-16 ENCOUNTER — Ambulatory Visit: Payer: Self-pay | Attending: Internal Medicine

## 2023-12-16 DIAGNOSIS — E018 Other iodine-deficiency related thyroid disorders and allied conditions: Secondary | ICD-10-CM

## 2023-12-17 ENCOUNTER — Telehealth: Payer: Self-pay | Admitting: Internal Medicine

## 2023-12-17 ENCOUNTER — Ambulatory Visit: Payer: Self-pay | Admitting: Internal Medicine

## 2023-12-17 LAB — TSH: TSH: 0.484 u[IU]/mL (ref 0.450–4.500)

## 2023-12-17 NOTE — Telephone Encounter (Signed)
 CONFIRMED APPT FOR 12/4

## 2023-12-18 ENCOUNTER — Other Ambulatory Visit: Payer: Self-pay

## 2023-12-18 ENCOUNTER — Ambulatory Visit: Payer: Self-pay | Attending: Internal Medicine | Admitting: Internal Medicine

## 2023-12-18 ENCOUNTER — Encounter: Payer: Self-pay | Admitting: Internal Medicine

## 2023-12-18 DIAGNOSIS — R079 Chest pain, unspecified: Secondary | ICD-10-CM

## 2023-12-18 DIAGNOSIS — E018 Other iodine-deficiency related thyroid disorders and allied conditions: Secondary | ICD-10-CM

## 2023-12-18 DIAGNOSIS — E782 Mixed hyperlipidemia: Secondary | ICD-10-CM

## 2023-12-18 MED ORDER — LEVOTHYROXINE SODIUM 150 MCG PO TABS
150.0000 ug | ORAL_TABLET | Freq: Every day | ORAL | 1 refills | Status: AC
  Filled 2023-12-18: qty 90, 90d supply, fill #0

## 2023-12-18 NOTE — Patient Instructions (Addendum)
 Dislipidemia Dyslipidemia La dislipidemia es un desequilibrio de sustancias cerosas parecidas a la grasa (lpidos) en la sangre. El cuerpo necesita lpidos en pequeas cantidades. Con frecuencia, la dislipidemia implica un nivel alto de colesterol o triglicridos, que son tipos de lpidos. Las formas frecuentes de dislipidemia incluyen las siguientes: Niveles elevados de colesterol LDL. El LDL es el tipo de colesterol que causa la acumulacin de depsitos de grasa (placas) en los vasos sanguneos que transportan la sangre fuera del corazn (arterias). Niveles bajos de colesterol HDL. El HDL es el tipo de colesterol que brinda proteccin contra las enfermedades cardacas. Los niveles altos de HDL eliminan la acumulacin de LDL de las arterias. Niveles altos de triglicridos. Los triglicridos son ignacia sustancia grasa presente en la sangre que se relaciona con la acumulacin de placa en las arterias. Cules son las causas? Hay dos tipos principales de dislipidemia: primaria y secundaria. La dislipidemia primaria es causada por cambios (mutaciones) en los genes que se transmiten a travs de las familias (se heredan). Estas mutaciones causan varios tipos de dislipidemia. La dislipidemia secundaria puede ser causada por diversos factores de riesgo que pueden provocar la enfermedad, como las opciones de estilo de vida y ciertas enfermedades. Qu incrementa el riesgo? Tiene ms probabilidades de aeronautical engineer afeccin si es un hombre mayor o si es una mujer que ha pasado por la menopausia. Otros factores de riesgo son los siguientes: Tener antecedentes familiares de dislipidemia. Tomar determinados medicamentos, entre ellos, pldoras anticonceptivas, corticoesteroides, algunos diurticos y betabloqueantes. Seguir una dieta con alto contenido de grasas saturadas. Fumar cigarrillos o beber alcohol en exceso. Tener ciertas afecciones mdicas, como diabetes, sndrome de ovario poliqustico (SOP), enfermedad  renal, enfermedad heptica o hipotiroidismo. No hacer ejercicio regularmente. Tener sobrepeso o ser obeso con demasiada grasa en el abdomen. Cules son los signos o sntomas? En la international business machines, la dislipidemia no causa ningn sntoma. En los new brenda graves, los niveles muy altos de lpidos pueden causar: Protuberancias de grasa debajo de la piel (xantomas). Un anillo blanco o gris alrededor del centro negro (pupila) del ojo. Los niveles muy altos de triglicridos pueden causar inflamacin del pncreas (pancreatitis). Cmo se diagnostica? Su mdico puede diagnosticar dislipidemia basndose en un anlisis de sangre de rutina (anlisis de sangre en [place]). Como la harley-davidson de las personas no tienen sntomas de la afeccin, este anlisis de sangre (perfil de lpidos) se realiza en adultos mayores de 20 aos y se repite cada 4 a 6 aos. En este anlisis, se controla lo siguiente: Colesterol total. Esto mide la cantidad total de colesterol en la sangre, que incluye el colesterol LDL, el colesterol HDL y los triglicridos. Un valor saludable est por debajo de 200 mg/dl (4.82 mmol/l). Colesterol LDL. El valor objetivo de colesterol LDL es diferente para cada persona, en funcin de los factores de riesgo individuales. Un valor saludable suele estar por debajo de 100 mg/dl (7.40 mmol/l). Consulte al mdico cul debe ser el valor del colesterol LDL para usted. Colesterol HDL. Un nivel de colesterol HDL de 60 mg/dl (8.44 mmol/l) o superior es lo mejor porque ayuda a health visitor las enfermedades cardacas. Un valor por debajo de 40 mg/dl (8.96 mmol/l) para los hombres o por debajo de 50 mg/dl (8.70 mmol/l) para las mujeres aumenta el riesgo de enfermedad cardaca. Triglicridos. Un valor de triglicridos saludable est por debajo de 150 mg/dl (8.30 mmol/l). Si su perfil de lpidos es anormal, su mdico puede realizar otros anlisis de Marie. Cmo se trata? [place]  depende del tipo de  dislipidemia que usted tenga y sus otros factores de riesgo de enfermedades cardacas o accidente cerebrovascular. Su mdico tendr un rango objetivo para sus niveles de lpidos en funcin de esta informacin. El tratamiento para la dislipidemia comienza con cambios en el estilo de vida, tales como dieta y ejercicio. El mdico podra recomendarle que haga lo siguiente: Hacer ejercicio con regularidad. Realizar cambios en la dieta. Si fuma, dejar de hacerlo. Limitar el consumo de bebidas alcohlicas. Si los cambios en la dieta y la actividad fsica no ayudan a alcanzar sus objetivos, el mdico tambin puede recetarle medicamentos para disminuir los lpidos. El tipo de medicamento recetado con ms frecuencia disminuye el colesterol LDL (estatinas). Si tiene un nivel alto de triglicridos, su mdico puede recetarle otro tipo de frmaco (fibratos) o un suplemento de aceite de pescado con omega-3, o ambos. Siga estas instrucciones en su casa: Comida y bebida  Siga las indicaciones del mdico o el nutricionista respecto de las restricciones para las comidas o las bebidas. Siga una dieta saludable como se lo haya indicado el mdico. Esto puede ayudarle a barista y pharmacologist un peso saludable, reducir el colesterol LDL y aumentar el colesterol HDL. Puede incluir: Limitar sus caloras, si tiene sobrepeso. Comer ms frutas, verduras, cereales integrales, pescado y carnes magras. Limitar las grasas saturadas, las grasas trans y print production planner. No beba alcohol si: Su mdico le indica no hacerlo. Est embarazada, puede estar embarazada o est tratando de quedar embarazada. Si bebe alcohol: Limite la cantidad que bebe a lo siguiente: De 0 a 1 medida por da para las mujeres. De 0 a 2 medidas por da para los hombres. Sepa cunta cantidad de alcohol hay en las bebidas que toma. En los [address], una medida equivale a una botella de cerveza de 12 oz (355 ml), un vaso de vino de 5 oz (148 ml) o un vaso de  una bebida alcohlica de alta graduacin de 1 oz (44 ml). Actividad Haga ejercicio con regularidad. Siga un programa de ejercicio y entrenamiento de fuerza tal como se lo haya indicado el mdico. Pregntele al mdico qu actividades son seguras para usted. El mdico puede recomendarle lo siguiente: 30 minutos de actividad aerbica de 4 a 6 das por [address] La caminata a paso ligero es un ejemplo de actividad aerbica. Entrenamiento de fuerza 2 eli lilly and company. Indicaciones generales No consuma ningn producto que contenga nicotina o tabaco. Estos productos incluyen cigarrillos, tabaco para theatre manager y aparatos de vapeo, como los cigarrillos electrnicos. Si necesita ayuda para dejar de consumir estos productos, consulte al american express. Use los medicamentos de venta libre y los recetados solamente como se lo haya indicado el mdico. Esto incluye los suplementos. Concurra a todas las visitas de seguimiento. Esto es importante. Comunquese con un mdico si: Tiene dificultad para cumplir con su plan de actividad fsica o su dieta. Le cuesta dejar de fumar o controlar el consumo de alcohol. Resumen Con frecuencia, la dislipidemia implica un nivel alto de colesterol o triglicridos, que son tipos de lpidos. El tratamiento depende del tipo de dislipidemia que usted tenga y sus otros factores de riesgo de enfermedades cardacas o accidente cerebrovascular. El tratamiento para la dislipidemia comienza con cambios en el estilo de vida, tales como dieta y ejercicio. Su mdico puede recetarle medicamentos para disminuir los lpidos. Esta informacin no tiene theme park manager el consejo del mdico. Asegrese de hacerle al mdico cualquier pregunta que tenga. Document Revised: 03/17/2020 Document Reviewed: 03/17/2020 Elsevier Patient  Education  [employer].   VISIT SUMMARY: Today, we discussed your blood pressure management, dietary counseling for cholesterol, chest pain, and your thyroid  medication.  Your blood pressure has improved, and we talked about ways to manage your cholesterol through diet. We also addressed your recent chest pain and made plans for further evaluation. Additionally, we provided a 90-day supply of your thyroid  medication for your upcoming travel.  YOUR PLAN: -CHEST PAIN: Chest pain can be a sign of heart problems, especially given your age and gender. We ordered an EKG today and referred you to a cardiologist for further evaluation. If the symptoms persist while you are in [place], please seek a cardiology consultation there as well.  -MIXED HYPERLIPIDEMIA: Mixed hyperlipidemia means you have high levels of bad cholesterol (LDL) and other fats in your blood. We discussed dietary changes to help manage this, including using olive oil instead of lard, choosing 1% or skim milk, reducing sugar intake, and moderating alcohol consumption. We provided you with printed information on cholesterol management.  -IODINE HYPOTHYROIDISM: Iodine hypothyroidism is a condition where your thyroid  does not produce enough hormones, and it requires ongoing medication. We prescribed a 90-day supply of levothyroxine  to cover your needs while you are traveling in [place].  -HYPERTENSION: Hypertension is high blood pressure. Your blood pressure is currently well-controlled at 120/76 mmHg. We discussed the importance of continuing to reduce dietary salt and encouraged regular blood pressure monitoring if possible.  INSTRUCTIONS: Please follow up with a cardiologist for your chest pain evaluation. If you experience persistent symptoms while in [place], seek a cardiology consultation there. Continue with the dietary changes we discussed to manage your cholesterol and maintain your blood pressure. Make sure to take your levothyroxine  as prescribed and monitor your blood pressure regularly if you can.                      Contains text generated by Abridge.                                  Contains text generated by Abridge.

## 2023-12-18 NOTE — Progress Notes (Signed)
 Patient ID: Daniel Fuller, male    DOB: [birth year]  MRN: [id number]  CC: Follow-up (BP recheck. /Discuss healthy eating habits - discuss healthy alternatives due to high cholesterol /Traveling - requesting 90 days supply/Pain on L side of chesst radiating to L shoulder X1 week/No to flu vax)   Subjective: Daniel Fuller is a 48 y.o. male who presents for chronic ds management. Daniel Jenkins Houseman NP is shadowing me. His concerns today include:  Patient with history of GERD, chronic constipation, rectal itching, hypothyroidism, HL   Discussed the use of AI scribe software for clinical note transcription with the patient, who gave verbal consent to proceed.  History of Present Illness Daniel Fuller is a 48 year old male with  elev BP for recheck and hyperlipidemia who presents for follow-up on blood pressure management and dietary counseling.  His blood pressure was previously elevated but has improved today. He has not been able to access a blood pressure device for home monitoring. He attempted to limit salt intake for two days but experienced discomfort in his side, arm, and stomach.  He is concerned about his cholesterol levels, with a previous LDL cholesterol reading of 127 mg/dL. He is seeking advice on healthy dietary alternatives to manage his cholesterol.   He experiences pain on the left side of his chest and left arm, described as a pressure with a severity of 2-3 out of 10. This has been occurring for the past two weeks, sometimes associated with shortness of breath and itching in the center of the chest. The pain is not consistently related to physical activity and sometimes occurs when sitting or after eating. No numbness in the arm. He walks regularly, with the last walk being the previous day, and did not experience pain during walking. He has a history of smoking but quit over 20 years ago. He does not currently smoke. No fhx of heart ds.     Hypothyroid: He requests a 90-day supply of his thyroid  medication, levothyroxine , as he plans to travel to [place] and will be gone for 3 mths.     Patient Active Problem List   Diagnosis Date Noted   Influenza vaccine refused 02/21/2020   Pterygium of right eye 02/21/2020   Constipation 02/21/2020   Bradycardia 02/21/2020   Gastroesophageal reflux disease without esophagitis 01/13/2018   Positive depression screening 01/13/2018   Obesity (BMI 30-39.9) 01/13/2018   Bacterial infection due to H. pylori 05/27/2016   Epigastric pain 04/15/2016   HYPOTHYROIDISM, POST-RADIOACTIVE IODINE 01/05/2007     No current outpatient medications on file prior to visit.   No current facility-administered medications on file prior to visit.    Allergies  Allergen Reactions   Omeprazole      headache    Social History   Socioeconomic History   Marital status: Married    Spouse name: Not on file   Number of children: Not on file   Years of education: Not on file   Highest education level: Not on file  Occupational History   Not on file  Tobacco Use   Smoking status: Former    Current packs/day: 0.00    Average packs/day: 0.4 packs/day for 6.0 years (2.4 ttl pk-yrs)    Types: Cigarettes    Start date: 08/06/1994    Quit date: 08/05/2000    Years since quitting: 23.3   Smokeless tobacco: Current  Substance and Sexual Activity   Alcohol use: Yes   Drug use: No   Sexual activity:  Yes  Other Topics Concern   Not on file  Social History Narrative   Not on file   Social Drivers of Health   Financial Resource Strain: Low Risk  (10/30/2023)   Overall Financial Resource Strain (CARDIA)    Difficulty of Paying Living Expenses: Not very hard  Food Insecurity: No Food Insecurity (10/30/2023)   Hunger Vital Sign    Worried About Running Out of Food in the Last Year: Never true    Ran Out of Food in the Last Year: Never true  Transportation Needs: No Transportation Needs  (10/30/2023)   PRAPARE - Administrator, Civil Service (Medical): No    Lack of Transportation (Non-Medical): No  Physical Activity: Sufficiently Active (10/30/2023)   Exercise Vital Sign    Days of Exercise per Week: 3 days    Minutes of Exercise per Session: 60 min  Stress: No Stress Concern Present (10/30/2023)   [employer] of Occupational Health - Occupational Stress Questionnaire    Feeling of Stress: Not at all  Social Connections: Socially Integrated (10/30/2023)   Social Connection and Isolation Panel    Frequency of Communication with Friends and Family: More than three times a week    Frequency of Social Gatherings with Friends and Family: More than three times a week    Attends Religious Services: More than 4 times per year    Active Member of [employer] or Organizations: Yes    Attends Banker Meetings: More than 4 times per year    Marital Status: Married  Catering Manager Violence: Not At Risk (10/30/2023)   Humiliation, Afraid, Rape, and Kick questionnaire    Fear of Current or Ex-Partner: No    Emotionally Abused: No    Physically Abused: No    Sexually Abused: No    No family history on file.  No past surgical history on file.  ROS: Review of Systems Negative except as stated above  PHYSICAL EXAM: BP 120/76 (BP Location: Left Arm, Patient Position: Sitting, Cuff Size: Normal)   Pulse (!) 57   Temp 98.3 F (36.8 C) (Oral)   Ht 5' 8 (1.727 m)   Wt 208 lb (94.3 kg)   SpO2 99%   BMI 31.63 kg/m   Physical Exam   General appearance - alert, well appearing, middle age Hispanic male and in no distress Mental status - normal mood, behavior, speech, dress, motor activity, and thought processes Neck - supple, no significant adenopathy Chest - clear to auscultation, no wheezes, rales or rhonchi, symmetric air entry Heart - normal rate, regular rhythm, normal S1, S2, no murmurs, rubs, clicks or gallops. No reproducible tenderness on  palpation of LT anterior chest wall.     Latest Ref Rng & Units 02/19/2023   10:17 AM 04/09/2022    4:32 PM 05/30/2021    4:30 PM  CMP  Glucose 70 - 99 mg/dL 85   82   BUN 6 - 24 mg/dL 12   17   Creatinine 9.23 - 1.27 mg/dL 9.18   9.13   Sodium 865 - 144 mmol/L 139   142   Potassium 3.5 - 5.2 mmol/L 4.3   4.3   Chloride 96 - 106 mmol/L 102   104   CO2 20 - 29 mmol/L 24   25   Calcium  8.7 - 10.2 mg/dL 9.1   9.2   Total Protein 6.0 - 8.5 g/dL 6.6  6.9  7.1   Total Bilirubin 0.0 - 1.2 mg/dL  0.3  0.2  0.2   Alkaline Phos 44 - 121 IU/L 78  84  86   AST 0 - 40 IU/L 28  31  39   ALT 0 - 44 IU/L 30  29  39    Lipid Panel     Component Value Date/Time   CHOL 197 02/19/2023 1017   TRIG 128 02/19/2023 1017   HDL 47 02/19/2023 1017   CHOLHDL 4.2 02/19/2023 1017   CHOLHDL 4.2 03/19/2016 0917   VLDL 21 03/19/2016 0917   LDLCALC 127 (H) 02/19/2023 1017    CBC    Component Value Date/Time   WBC 7.9 02/21/2020 1150   WBC 6.9 03/19/2016 0917   RBC 5.36 02/21/2020 1150   RBC 5.14 03/19/2016 0917   HGB 15.1 02/21/2020 1150   HCT 44.1 02/21/2020 1150   PLT 306 02/21/2020 1150   MCV 82 02/21/2020 1150   MCH 28.2 02/21/2020 1150   MCH 28.6 03/19/2016 0917   MCHC 34.2 02/21/2020 1150   MCHC 34.3 03/19/2016 0917   RDW 11.6 02/21/2020 1150   LYMPHSABS 1,794 03/19/2016 0917   MONOABS 414 03/19/2016 0917   EOSABS 69 03/19/2016 0917   BASOSABS 0 03/19/2016 0917   EKG: Sinus brady without acute changes; unchanged from previous study 2022  Lab Results  Component Value Date   TSH 0.484 12/16/2023      ASSESSMENT AND PLAN: 1. Mixed hyperlipidemia LDL cholesterol elevated at 127 mg/dL, above target of <899 mg/dL. Discussed dietary modifications to reduce saturated fats and increase unsaturated fats. Emphasized importance of reducing sugar intake, particularly from cereals. Advised moderation in alcohol consumption due to sugar content. - Provided dietary summary and printed information  on cholesterol management. - Advised use of olive oil instead of lard. - Recommended 1% or skim milk instead of 2% milk.  2. Chest pain in adult Intermittent left-sided chest pain with pressure sensation, occasionally radiating to the left arm, present for two weeks. Pain is mild (2-3/10) and not consistently associated with activity. No family history of heart disease, but mild hyperlipidemia is present. EKG unchanged from 2022. Differential includes cardiac etiology due to age and gender, despite atypical presentation. - Ordered EKG today. - Discussed referral to cardiology but he will be leaving for [place] to soon - Advised to seek cardiology consultation in [place] - EKG 12-Lead  3. HYPOTHYROIDISM, POST-RADIOACTIVE IODINE Recent TSH w/in nl range. - levothyroxine  (SYNTHROID ) 150 MCG tablet; Take 1 tablet (150 mcg total) by mouth daily.  Dispense: 90 tablet; Refill: 1   Patient was given the opportunity to ask questions.  Patient verbalized understanding of the plan and was able to repeat key elements of the plan.   This documentation was completed using Paediatric nurse.  Any transcriptional errors are unintentional.  Orders Placed This Encounter  Procedures   EKG 12-Lead     Requested Prescriptions   Signed Prescriptions Disp Refills   levothyroxine  (SYNTHROID ) 150 MCG tablet 90 tablet 1    Sig: Take 1 tablet (150 mcg total) by mouth daily.    Return in about 6 months (around 06/17/2024).  Barnie Louder, MD, FACP

## 2023-12-19 ENCOUNTER — Ambulatory Visit: Payer: Self-pay

## 2024-03-12 ENCOUNTER — Ambulatory Visit: Payer: Self-pay | Admitting: Internal Medicine

## 2024-06-17 ENCOUNTER — Ambulatory Visit: Payer: Self-pay | Admitting: Internal Medicine
